# Patient Record
Sex: Female | Born: 1965 | Race: Black or African American | Hispanic: No | Smoking: Never smoker
Health system: Southern US, Community
[De-identification: ages and names within clinical notes are randomized; demographics above are authoritative.]

## PROBLEM LIST (undated history)

## (undated) DIAGNOSIS — Z8489 Family history of other specified conditions: Secondary | ICD-10-CM

## (undated) DIAGNOSIS — I10 Essential (primary) hypertension: Secondary | ICD-10-CM

## (undated) DIAGNOSIS — E119 Type 2 diabetes mellitus without complications: Secondary | ICD-10-CM

## (undated) HISTORY — PX: BACK SURGERY: SHX140

## (undated) HISTORY — PX: BREAST REDUCTION SURGERY: SHX8

---

## 2015-09-13 ENCOUNTER — Other Ambulatory Visit: Payer: Self-pay | Admitting: Neurosurgery

## 2015-09-13 DIAGNOSIS — M5416 Radiculopathy, lumbar region: Secondary | ICD-10-CM

## 2015-09-17 ENCOUNTER — Ambulatory Visit
Admission: RE | Admit: 2015-09-17 | Discharge: 2015-09-17 | Disposition: A | Discharge status: Home / Self Care | Payer: BLUE CROSS/BLUE SHIELD | Source: Ambulatory Visit | Attending: Neurosurgery | Admitting: Neurosurgery

## 2015-09-17 DIAGNOSIS — M5416 Radiculopathy, lumbar region: Secondary | ICD-10-CM

## 2015-09-17 MED ORDER — IOPAMIDOL (ISOVUE-M 200) INJECTION 41%
1.0000 mL | Freq: Once | INTRAMUSCULAR | Status: AC
  Administered 2015-09-17: 1 mL via EPIDURAL

## 2015-09-17 MED ORDER — METHYLPREDNISOLONE ACETATE 40 MG/ML INJ SUSP (RADIOLOG
120.0000 mg | Freq: Once | INTRAMUSCULAR | Status: AC
  Administered 2015-09-17: 120 mg via EPIDURAL

## 2015-09-17 NOTE — Discharge Instructions (Signed)

## 2016-08-11 ENCOUNTER — Telehealth: Payer: Self-pay

## 2016-08-11 NOTE — Telephone Encounter (Signed)
See separate triage.  

## 2016-08-11 NOTE — Telephone Encounter (Signed)
Pt received a letter from DS to set up first colonoscopy. She is having no GI problems. [phone]

## 2016-08-12 NOTE — Telephone Encounter (Signed)
Gastroenterology Pre-Procedure Review  Request Date: 08/11/2016 Requesting Physician:   PATIENT REVIEW QUESTIONS: The patient responded to the following health history questions as indicated:    1. Diabetes Melitis: Recently diagnosed, but on diet at this time  2. Joint replacements in the past 12 months: no 3. Major health problems in the past 3 months: no 4. Has an artificial valve or MVP: no 5. Has a defibrillator: no 6. Has been advised in past to take antibiotics in advance of a procedure like teeth cleaning: no 7. Family history of colon cancer: no  8. Alcohol Use: no 9. History of sleep apnea: no  10. History of coronary artery or other vascular stents placed within the last 12 months: no    MEDICATIONS & ALLERGIES:    Patient reports the following regarding taking any blood thinners:   Plavix? no Aspirin? no Coumadin? no Brilinta? no Xarelto? no Eliquis? no Pradaxa? no Savaysa? no Effient? no  Patient confirms/reports the following medications:  Current Outpatient Prescriptions  Medication Sig Dispense Refill  . hydrochlorothiazide (HYDRODIURIL) 25 MG tablet Take 25 mg by mouth daily.     No current facility-administered medications for this visit.     Patient confirms/reports the following allergies:  No Known Allergies  No orders of the defined types were placed in this encounter.   AUTHORIZATION INFORMATION Primary Insurance:   ID #:   Group #:  Pre-Cert / Auth required:  Pre-Cert / Auth #:   Secondary Insurance:   ID #:   Group #:  Pre-Cert / Auth required:  Pre-Cert / Auth #:   SCHEDULE INFORMATION: Procedure has been scheduled as follows:  Date:              Time:   Location:   This Gastroenterology Pre-Precedure Review Form is being routed to the following provider(s): Barney Drain, MD

## 2016-08-12 NOTE — Telephone Encounter (Signed)

## 2016-08-22 NOTE — Telephone Encounter (Signed)
Left VM to call to get appt date and time for procedure. Mailing a letter to call also.

## 2016-08-26 NOTE — Telephone Encounter (Signed)
PT has been scheduled for 10/20/2016 at 1:00 pm with Dr. Oneida Alar.

## 2016-08-28 ENCOUNTER — Other Ambulatory Visit: Payer: Self-pay

## 2016-08-28 DIAGNOSIS — Z1211 Encounter for screening for malignant neoplasm of colon: Secondary | ICD-10-CM

## 2016-08-28 MED ORDER — NA SULFATE-K SULFATE-MG SULF 17.5-3.13-1.6 GM/177ML PO SOLN: 1.0000 | ORAL | 0 refills | Status: DC

## 2016-08-28 NOTE — Telephone Encounter (Signed)
Rx sent to the pharmacy and instructions mailed to pt.  

## 2016-08-28 NOTE — Telephone Encounter (Signed)
Coupon for the [employer] mailed to pt.

## 2016-09-11 NOTE — Telephone Encounter (Signed)
NO PA is needed for TCS 

## 2016-10-20 ENCOUNTER — Encounter (HOSPITAL_COMMUNITY): Payer: Self-pay | Admitting: *Deleted

## 2016-10-20 ENCOUNTER — Encounter (HOSPITAL_COMMUNITY)
Admission: RE | Disposition: A | Discharge status: Home / Self Care | Payer: Self-pay | Source: Ambulatory Visit | Attending: Gastroenterology

## 2016-10-20 ENCOUNTER — Ambulatory Visit (HOSPITAL_COMMUNITY)
Admission: RE | Admit: 2016-10-20 | Discharge: 2016-10-20 | Disposition: A | Discharge status: Home / Self Care | Payer: BLUE CROSS/BLUE SHIELD | Source: Ambulatory Visit | Attending: Gastroenterology | Admitting: Gastroenterology

## 2016-10-20 DIAGNOSIS — K644 Residual hemorrhoidal skin tags: Secondary | ICD-10-CM | POA: Diagnosis not present

## 2016-10-20 DIAGNOSIS — Z79899 Other long term (current) drug therapy: Secondary | ICD-10-CM | POA: Insufficient documentation

## 2016-10-20 DIAGNOSIS — Z1212 Encounter for screening for malignant neoplasm of rectum: Secondary | ICD-10-CM

## 2016-10-20 DIAGNOSIS — D124 Benign neoplasm of descending colon: Secondary | ICD-10-CM | POA: Diagnosis not present

## 2016-10-20 DIAGNOSIS — Q438 Other specified congenital malformations of intestine: Secondary | ICD-10-CM | POA: Insufficient documentation

## 2016-10-20 DIAGNOSIS — I10 Essential (primary) hypertension: Secondary | ICD-10-CM | POA: Insufficient documentation

## 2016-10-20 DIAGNOSIS — K648 Other hemorrhoids: Secondary | ICD-10-CM | POA: Diagnosis not present

## 2016-10-20 DIAGNOSIS — D123 Benign neoplasm of transverse colon: Secondary | ICD-10-CM | POA: Insufficient documentation

## 2016-10-20 DIAGNOSIS — Z1211 Encounter for screening for malignant neoplasm of colon: Secondary | ICD-10-CM

## 2016-10-20 HISTORY — DX: Essential (primary) hypertension: I10

## 2016-10-20 HISTORY — DX: Family history of other specified conditions: Z84.89

## 2016-10-20 HISTORY — PX: COLONOSCOPY: SHX5424

## 2016-10-20 HISTORY — DX: Type 2 diabetes mellitus without complications: E11.9

## 2016-10-20 LAB — GLUCOSE, CAPILLARY: Glucose-Capillary: 132 mg/dL — ABNORMAL HIGH (ref 65–99)

## 2016-10-20 SURGERY — COLONOSCOPY
Anesthesia: Moderate Sedation

## 2016-10-20 MED ORDER — MIDAZOLAM HCL 5 MG/5ML IJ SOLN
INTRAMUSCULAR | Status: AC
  Filled 2016-10-20: qty 10

## 2016-10-20 MED ORDER — STERILE WATER FOR IRRIGATION IR SOLN
Status: DC | PRN
  Administered 2016-10-20: 2.5 mL

## 2016-10-20 MED ORDER — MEPERIDINE HCL 100 MG/ML IJ SOLN
INTRAMUSCULAR | Status: DC
Start: 2016-10-20 — End: 2016-10-20
  Filled 2016-10-20: qty 2

## 2016-10-20 MED ORDER — MEPERIDINE HCL 100 MG/ML IJ SOLN
INTRAMUSCULAR | Status: DC | PRN
  Administered 2016-10-20 (×4): 25 mg via INTRAVENOUS

## 2016-10-20 MED ORDER — SODIUM CHLORIDE 0.9 % IV SOLN
INTRAVENOUS | Status: DC
  Administered 2016-10-20: 1000 mL via INTRAVENOUS

## 2016-10-20 MED ORDER — MIDAZOLAM HCL 5 MG/5ML IJ SOLN
INTRAMUSCULAR | Status: DC | PRN
  Administered 2016-10-20 (×3): 2 mg via INTRAVENOUS

## 2016-10-20 NOTE — Discharge Instructions (Signed)
You had 2 polyps removed. You have internal hemorrhoids.   CONTINUE YOUR WEIGHT LOSS EFFORTS. LOSE TEN POUNDS.  WHILE I DO NOT WANT TO ALARM YOU, YOUR BODY MASS INDEX IS OVER 30 WHICH MEANS YOU ARE OBESE. Obesity activates cancer genes and IS ASSOCIATED WITH AN INCREASE RISK FOR ALL CANCERS, INCLUDING ESOPHAGEAL AND COLON CANCER. TRANSITION TO A PLANT BASED DIET-NO MEAT OR DAIRY for 6 MOS. AVOID ITEMS THAT CAUSE BLOATING & GAS. I RECOMMEND THE BOOK, "PREVENT AND REVERSE HEART DISEASE", CALDWELL ESSELSTYN JR., MD. PAGES 120-121 [place] THE DIET AND THE LAST HALF OF THE BOOK HAS QUICK AND EASY RECIPES FOR BREAKFAST, LUNCH, AND DINNER ARE AFTER P 127.     DRINK WATER TO KEEP YOUR URINE LIGHT YELLOW.  FOLLOW A HIGH FIBER DIET. AVOID ITEMS THAT CAUSE BLOATING & GAS. SEE INFO BELOW.  YOUR BIOPSY RESULTS WILL BE AVAILABLE IN MY CHART AFTER JUL 27 AND MY OFFICE WILL CONTACT YOU IN 10-14 DAYS WITH YOUR RESULTS.   Next colonoscopy in 5-10 years.    Colonoscopy Care After Read the instructions outlined below and refer to this sheet in the next week. These discharge instructions provide you with general information on caring for yourself after you leave the hospital. While your treatment has been planned according to the most current medical practices available, unavoidable complications occasionally occur. If you have any problems or questions after discharge, call DR. Kevia Zaucha, [phone].  ACTIVITY  You may resume your regular activity, but move at a slower pace for the next 24 hours.   Take frequent rest periods for the next 24 hours.   Walking will help get rid of the air and reduce the bloated feeling in your belly (abdomen).   No driving for 24 hours (because of the medicine (anesthesia) used during the test).   You may shower.   Do not sign any important legal documents or operate any machinery for 24 hours (because of the anesthesia used during the test).     NUTRITION  Drink plenty of fluids.   You may resume your normal diet as instructed by your doctor.   Begin with a light meal and progress to your normal diet. Heavy or fried foods are harder to digest and may make you feel sick to your stomach (nauseated).   Avoid alcoholic beverages for 24 hours or as instructed.    MEDICATIONS  You may resume your normal medications.   WHAT YOU CAN EXPECT TODAY  Some feelings of bloating in the abdomen.   Passage of more gas than usual.   Spotting of blood in your stool or on the toilet paper  .  IF YOU HAD POLYPS REMOVED DURING THE COLONOSCOPY:  Eat a soft diet IF YOU HAVE NAUSEA, BLOATING, ABDOMINAL PAIN, OR VOMITING.    FINDING OUT THE RESULTS OF YOUR TEST Not all test results are available during your visit. DR. Oneida Alar WILL CALL YOU WITHIN 14 DAYS OF YOUR PROCEDUE WITH YOUR RESULTS. Do not assume everything is normal if you have not heard from DR. Faisal Stradling, CALL HER OFFICE AT [phone].  SEEK IMMEDIATE MEDICAL ATTENTION AND CALL THE OFFICE: [phone] IF:  You have more than a spotting of blood in your stool.   Your belly is swollen (abdominal distention).   You are nauseated or vomiting.   You have a temperature over 101F.   You have abdominal pain or discomfort that is severe or gets worse throughout the day.   High-Fiber Diet  A high-fiber diet changes your normal diet to include more whole grains, legumes, fruits, and vegetables. Changes in the diet involve replacing refined carbohydrates with unrefined foods. The calorie level of the diet is essentially unchanged. The Dietary Reference Intake (recommended amount) for adult males is 38 grams per day. For adult females, it is 25 grams per day. Pregnant and lactating women should consume 28 grams of fiber per day. Fiber is the intact part of a plant that is not broken down during digestion. Functional fiber is fiber that has been isolated from the plant to provide a  beneficial effect in the body. PURPOSE  Increase stool bulk.   Ease and regulate bowel movements.   Lower cholesterol.   REDUCE RISK OF COLON CANCER  INDICATIONS THAT YOU NEED MORE FIBER  Constipation and hemorrhoids.   Uncomplicated diverticulosis (intestine condition) and irritable bowel syndrome.   Weight management.   As a protective measure against hardening of the arteries (atherosclerosis), diabetes, and cancer.   GUIDELINES FOR INCREASING FIBER IN THE DIET  Start adding fiber to the diet slowly. A gradual increase of about 5 more grams (2 slices of whole-wheat bread, 2 servings of most fruits or vegetables, or 1 bowl of high-fiber cereal) per day is best. Too rapid an increase in fiber may result in constipation, flatulence, and bloating.   Drink enough water and fluids to keep your urine clear or pale yellow. Water, juice, or caffeine-free drinks are recommended. Not drinking enough fluid may cause constipation.   Eat a variety of high-fiber foods rather than one type of fiber.   Try to increase your intake of fiber through using high-fiber foods rather than fiber pills or supplements that contain small amounts of fiber.   The goal is to change the types of food eaten. Do not supplement your present diet with high-fiber foods, but replace foods in your present diet.   INCLUDE A VARIETY OF FIBER SOURCES  Replace refined and processed grains with whole grains, canned fruits with fresh fruits, and incorporate other fiber sources. White rice, white breads, and most bakery goods contain little or no fiber.   Brown whole-grain rice, buckwheat oats, and many fruits and vegetables are all good sources of fiber. These include: broccoli, Brussels sprouts, cabbage, cauliflower, beets, sweet potatoes, white potatoes (skin on), carrots, tomatoes, eggplant, squash, berries, fresh fruits, and dried fruits.   Cereals appear to be the richest source of fiber. Cereal fiber is found in  whole grains and bran. Bran is the fiber-rich outer coat of cereal grain, which is largely removed in refining. In whole-grain cereals, the bran remains. In breakfast cereals, the largest amount of fiber is found in those with "bran" in their names. The fiber content is sometimes indicated on the label.   You may need to include additional fruits and vegetables each day.   In baking, for 1 cup white flour, you may use the following substitutions:   1 cup whole-wheat flour minus 2 tablespoons.   1/2 cup white flour plus 1/2 cup whole-wheat flour.   Polyps, Colon  A polyp is extra tissue that grows inside your body. Colon polyps grow in the large intestine. The large intestine, also called the colon, is part of your digestive system. It is a long, hollow tube at the end of your digestive tract where your body makes and stores stool. Most polyps are not dangerous. They are benign. This means they are not cancerous. But over time, some types of polyps can  turn into cancer. Polyps that are smaller than a pea are usually not harmful. But larger polyps could someday become or may already be cancerous. To be safe, doctors remove all polyps and test them.   WHO GETS POLYPS? Anyone can get polyps, but certain people are more likely than others. You may have a greater chance of getting polyps if:  You are over 50.   You have had polyps before.   Someone in your family has had polyps.   Someone in your family has had cancer of the large intestine.   Find out if someone in your family has had polyps. You may also be more likely to get polyps if you:   Eat a lot of fatty foods   Smoke   Drink alcohol   Do not exercise  Eat too much   PREVENTION There is not one sure way to prevent polyps. You might be able to lower your risk of getting them if you:  Eat more fruits and vegetables and less fatty food.   Do not smoke.   Avoid alcohol.   Exercise every day.   Lose weight if you are  overweight.   Eating more calcium and folate can also lower your risk of getting polyps. Some foods that are rich in calcium are milk, cheese, and broccoli. Some foods that are rich in folate are chickpeas, kidney beans, and spinach.   Hemorrhoids Hemorrhoids are dilated (enlarged) veins around the rectum. Sometimes clots will form in the veins. This makes them swollen and painful. These are called thrombosed hemorrhoids. Causes of hemorrhoids include:  Constipation.   Straining to have a bowel movement.   HEAVY LIFTING  HOME CARE INSTRUCTIONS  Eat a well balanced diet and drink 6 to 8 glasses of water every day to avoid constipation. You may also use a bulk laxative.   Avoid straining to have bowel movements.   Keep anal area dry and clean.   Do not use a donut shaped pillow or sit on the toilet for long periods. This increases blood pooling and pain.   Move your bowels when your body has the urge; this will require less straining and will decrease pain and pressure.

## 2016-10-20 NOTE — H&P (Signed)
Primary Care Physician:  Manon Hilding, MD Primary Gastroenterologist:  Dr. Oneida Alar  Pre-Procedure History & Physical: HPI:  Gabriela Rush is a 51 y.o. female here for COLON CANCER SCREENING.   PMH: HTN  No past surgical history on file.  Prior to Admission medications   Medication Sig Start Date End Date Taking? Authorizing Provider  hydrochlorothiazide (HYDRODIURIL) 25 MG tablet Take 25 mg by mouth daily.   Yes [provider]    Allergies as of 08/28/2016  . (No Known Allergies)    FAMILY HISTORY: NO COLON CANCER/POLYPS  Social History   Social History  . Marital status: Widowed    Spouse name: N/A  . Number of children: N/A  . Years of education: N/A   Occupational History  . Not on file.   Social History Main Topics  . Smoking status: Never Smoker  . Smokeless tobacco: Never Used  . Alcohol use Not on file  . Drug use: Unknown  . Sexual activity: Not on file   Other Topics Concern  . Not on file   Social History Narrative  . No narrative on file    Review of Systems: See HPI, otherwise negative ROS   Physical Exam: LMP 08/11/2016  General:   Alert,  pleasant and cooperative in NAD Head:  Normocephalic and atraumatic. Neck:  Supple; Lungs:  Clear throughout to auscultation.    Heart:  Regular rate and rhythm. Abdomen:  Soft, nontender and nondistended. Normal bowel sounds, without guarding, and without rebound.   Neurologic:  Alert and  oriented x4;  grossly normal neurologically.  Impression/Plan:     SCREENING  Plan:  1. TCS TODAY. DISCUSSED PROCEDURE, BENEFITS, & RISKS: < 1% chance of medication reaction, bleeding, perforation, or rupture of spleen/liver.

## 2016-10-20 NOTE — Op Note (Signed)
[facility] Patient Name: Gabriela Rush Procedure Date: 10/20/2016 12:46 PM MRN: [id number] Date of Birth: [birth year] Attending MD: Barney Drain , MD CSN: [id number] Age: 51 Admit Type: Outpatient Procedure:                Colonoscopy with COLD SNARE/SNARE CAUTERY                            POLYPECTOMY Indications:              Screening for colorectal malignant neoplasm Providers:                Barney Drain, MD, Gwynneth Albright RN, RN,                            Randa Spike, Technician Referring MD:             Manon Hilding MD, MD Medicines:                Meperidine 100 mg IV, Midazolam 6 mg IV Complications:            No immediate complications. Estimated Blood Loss:     Estimated blood loss was minimal. Procedure:                Pre-Anesthesia Assessment:                           - Prior to the procedure, a History and Physical                            was performed, and patient medications and                            allergies were reviewed. The patient's tolerance of                            previous anesthesia was also reviewed. The risks                            and benefits of the procedure and the sedation                            options and risks were discussed with the patient.                            All questions were answered, and informed consent                            was obtained. Prior Anticoagulants: The patient has                            taken no previous anticoagulant or antiplatelet                            agents. ASA Grade Assessment: II - A patient with  mild systemic disease. After reviewing the risks                            and benefits, the patient was deemed in                            satisfactory condition to undergo the procedure.                            After obtaining informed consent, the colonoscope                            was passed under direct vision. Throughout the                             procedure, the patient's blood pressure, pulse, and                            oxygen saturations were monitored continuously. The                            EC-[id number] ([id number]) scope was introduced through                            the anus and advanced to the the cecum, identified                            by appendiceal orifice and ileocecal valve. The                            colonoscopy was technically difficult and complex                            due to significant looping. Successful completion                            of the procedure was aided by increasing the dose                            of sedation medication and COLOWRAP. The patient                            tolerated the procedure fairly well. The quality of                            the bowel preparation was excellent. The ileocecal                            valve, appendiceal orifice, and rectum were                            photographed. Scope In: 1:09:13 PM Scope Out: 1:26:43 PM Scope Withdrawal Time: 0 hours 13 minutes 24 seconds  Total Procedure Duration: 0  hours 17 minutes 30 seconds  Findings:      A 5 mm polyp was found in the mid descending colon. The polyp was       sessile. The polyp was removed with a hot snare. Resection and retrieval       were complete.      A 3 mm polyp was found in the distal transverse colon. The polyp was       sessile. The polyp was removed with a cold biopsy forceps. Resection and       retrieval were complete.      External and internal hemorrhoids were found during retroflexion. The       hemorrhoids were moderate. Impression:               - One 5 mm polyp in the mid descending colon,                            removed with a hot snare. Resected and retrieved.                           - One 3 mm polyp in the distal transverse colon,                            removed with a cold biopsy forceps. Resected and                             retrieved.                           - External and internal hemorrhoids. Moderate Sedation:      Moderate (conscious) sedation was administered by the endoscopy nurse       and supervised by the endoscopist. The following parameters were       monitored: oxygen saturation, heart rate, blood pressure, and response       to care. Total physician intraservice time was 28 minutes. Recommendation:           - Repeat colonoscopy in 5-10 years for surveillance.                           - High fiber diet.                           - Continue present medications. LOSE WEIGHT TO BMI                            < 30. CONSIDER A PLANT BASED DIET.                           - Patient has a contact number available for                            emergencies. The signs and symptoms of potential                            delayed complications were discussed with the  patient. Return to normal activities tomorrow.                            Written discharge instructions were provided to the                            patient. Procedure Code(s):        --- Professional ---                           [phone], Colonoscopy, flexible; with removal of                            tumor(s), polyp(s), or other lesion(s) by snare                            technique                           45380, 59, Colonoscopy, flexible; with biopsy,                            single or multiple                           99152, Moderate sedation services provided by the                            same physician or other qualified health care                            professional performing the diagnostic or                            therapeutic service that the sedation supports,                            requiring the presence of an independent trained                            observer to assist in the monitoring of the                            patient's level of consciousness and physiological                             status; initial 15 minutes of intraservice time,                            patient age 39 years or older                           [id number], Moderate sedation services; each additional                            15 minutes intraservice  time Diagnosis Code(s):        --- Professional ---                           Z12.11, Encounter for screening for malignant                            neoplasm of colon                           D12.4, Benign neoplasm of descending colon                           D12.3, Benign neoplasm of transverse colon (hepatic                            flexure or splenic flexure)                           K64.8, Other hemorrhoids CPT copyright 2016 [employer]. All rights reserved. The codes documented in this report are preliminary and upon coder review may  be revised to meet current compliance requirements. Barney Drain, MD Barney Drain, MD 10/20/2016 1:38:57 PM This report has been signed electronically. Number of Addenda: 0

## 2016-10-22 ENCOUNTER — Telehealth: Payer: Self-pay | Admitting: Gastroenterology

## 2016-10-22 NOTE — Telephone Encounter (Signed)
Pt is aware.  

## 2016-10-22 NOTE — Telephone Encounter (Signed)
Please call pt. She had TWO simple adenomas removed. NEXT COLONOSCOPY IN 5-10 YEARS.

## 2016-10-23 ENCOUNTER — Encounter (HOSPITAL_COMMUNITY): Payer: Self-pay | Admitting: Gastroenterology

## 2016-10-23 NOTE — Telephone Encounter (Signed)
Reminder in epic °

## 2017-02-02 ENCOUNTER — Telehealth: Payer: Self-pay

## 2017-02-02 NOTE — Telephone Encounter (Signed)
PT called to ask about results from her colonoscopy on 10/20/2016. I told her and told her that I had called her on 10/22/2016 and gave her the results. She said she was not aware. She had went to a doctor today and he told her the results and said she might need a colonoscopy before 5 years that she should call here. I told her again that Dr. Oneida Alar said she should have her next one in 5-10 years.

## 2017-02-03 NOTE — Telephone Encounter (Signed)
PLEASE CALL PT. SHE HAD TWO SIMPLE ADENOMAS REMOVED SHE DOES NOT NEED ANOTHER ROUTINE COLONOSCOPY BEFORE 2023. IF SHE HAS OTHER GI CONCERNS SHE CAN MAKE AN APPT TO DISCUSS THEM.

## 2017-02-04 NOTE — Telephone Encounter (Signed)
PT is aware.

## 2017-02-04 NOTE — Telephone Encounter (Signed)
LMOM to call.

## 2017-02-05 NOTE — Telephone Encounter (Signed)
REVIEWED-NO ADDITIONAL RECOMMENDATIONS. 

## 2017-02-05 NOTE — Telephone Encounter (Signed)
Pt called back and said she would like to get a copy of her colonoscopy report. I told her that she will have to come by and sign a release. She said she will be by today or tomorrow before noon.

## 2017-05-07 ENCOUNTER — Encounter (INDEPENDENT_AMBULATORY_CARE_PROVIDER_SITE_OTHER): Payer: Self-pay | Admitting: Orthopaedic Surgery

## 2017-05-07 ENCOUNTER — Ambulatory Visit (INDEPENDENT_AMBULATORY_CARE_PROVIDER_SITE_OTHER): Payer: BLUE CROSS/BLUE SHIELD | Admitting: Orthopaedic Surgery

## 2017-05-07 VITALS — BP 122/77 | HR 83

## 2017-05-07 DIAGNOSIS — M48062 Spinal stenosis, lumbar region with neurogenic claudication: Secondary | ICD-10-CM

## 2017-05-07 DIAGNOSIS — M48061 Spinal stenosis, lumbar region without neurogenic claudication: Secondary | ICD-10-CM | POA: Diagnosis not present

## 2017-05-07 NOTE — Addendum Note (Signed)
Addended by: Meyer Cory on: 05/07/2017 11:19 AM   Modules accepted: Orders

## 2017-05-07 NOTE — Progress Notes (Signed)
Office Visit Note   Patient: Gabriela Rush           Date of Birth: [birth year]           MRN: [id number] Visit Date: 05/07/2017              Requested by: Sasser, Silvestre Moment, MD [place], [place] [address] PCP: Manon Hilding, MD   Assessment & Plan: Visit Diagnoses:  1. Degenerative lumbar spinal stenosis   2. Neurogenic claudication due to lumbar spinal stenosis     Plan: Patient has neurogenic claudication with lumbar stenosis with L4-5 instability shifting on plain radiographs.  I recommend lumbar myelogram CT scan and I discussed with her that she likely will need a single level lumbar fusion for her progressive claudication symptoms.  This is progressed to the point where she has problems working and needs to keep her job.  Office follow-up after lumbar myelogram CT scan.  She is failed anti-inflammatories epidural steroid injections she has been through physical therapy she is home exercise program she is taken anti-inflammatories and also use the brace all without relief.  Follow-Up Instructions: No Follow-up on file.   Orders:  No orders of the defined types were placed in this encounter.  No orders of the defined types were placed in this encounter.     Procedures: No procedures performed   Clinical Data: No additional findings.   Subjective: Chief Complaint  Patient presents with  . Right Leg - Pain    HPI a 52 year old female with progressive back pain right greater than left leg pain with neurogenic claudication symptoms that have been present for more than 2 years.  Her pain is progressed the point where she has to lean on objects while she works.  She works as a Scientist, water quality.  She can go to the store as long she leans on a grocery cart.  Without it she has to stop and sit down.  She gets good relief with supine position or sitting.  When she stands up she has recurrent symptoms after 200 feet.  Patient does have some diabetes and also hypertension.  Previous MRI  2017 showed 2 mm of listhesis.  Flexion extension lateral x-rays showed changes at L4-5 anterolisthesis from 8 mm with extension to 13 mm with flexion.  Other levels show no anterolisthesis.  She had minimal disc narrowing at L4-5.  Patient denies any bowel bladder symptoms no fever chills no previous back surgeries.  She has had previous epidurals did not get relief.  She is used over-the-counter anti-inflammatories without relief.  She wears a back brace this is not been helpful.  Review of Systems 14 point review of system positive for hypertension.  Type 2 diabetes on metformin diet controlled.  She had previous breast reduction about 14 or 15 years ago.  Chronic back pain and claudication symptoms progressive over the last 2-3 years.  Patient is a widow she does not smoke but rarely drinks.  Negative as pertains HPI.   Objective: Vital Signs: BP 122/77   Pulse 83   Physical Exam  Constitutional: She is oriented to person, place, and time. She appears well-developed.  HENT:  Head: Normocephalic.  Right Ear: External ear normal.  Left Ear: External ear normal.  Eyes: Pupils are equal, round, and reactive to light.  Neck: No tracheal deviation present. No thyromegaly present.  Cardiovascular: Normal rate.  Pulmonary/Chest: Effort normal.  Abdominal: Soft.  Neurological: She is alert and oriented to  person, place, and time.  Skin: Skin is warm and dry.  Psychiatric: She has a normal mood and affect. Her behavior is normal.    Ortho Exam patient has intact reflexes negative straight leg raising 90 degrees she has some sciatic notch tenderness minimal trochanteric bursal tenderness.  Some bilateral tight hamstrings.  Anterior tib EHL gastrocsoleus peroneals are strong and good quad strength no hip flexion weakness.  Negative logroll to the hips.  Distal pulses are intact no plantar foot lesions.  No rash on exposed skin no venous stasis changes.  Specialty Comments:  No specialty  comments available.  Imaging: Previous images 2007 lumbar spine flexion extension lateral showed degenerative spondylolisthesis at L4-5 with intact pars with shifting from 8-14.5 mm with flexion and extension.  The previous lumbar MRI scan 2017 showed mild disc desiccation in the lumbar region without significant bulging no foraminal or lateral recess stenosis.  In the supine position she only had 2 mm of anterolisthesis at L4-5.   PMFS History: Patient Active Problem List   Diagnosis Date Noted  . Special screening for malignant neoplasms, colon    Past Medical History:  Diagnosis Date  . Diabetes mellitus without complication ([place])   . Family history of adverse reaction to anesthesia    Mother had problem with anesthesia; The "lost" her during a surgery; brought her back.  . Hypertension     Family History  Problem Relation Age of Onset  . Hypertension Mother   . Stroke Mother   . Kidney failure Mother   . Hypertension Father   . Diabetes Father   . Hypertension Sister   . Diabetes Sister   . Hypertension Sister   . Diabetes Sister     Past Surgical History:  Procedure Laterality Date  . BREAST REDUCTION SURGERY    . CESAREAN SECTION  1988  . COLONOSCOPY N/A 10/20/2016   Procedure: COLONOSCOPY;  Surgeon: Danie Binder, MD;  [facility];  Service: Endoscopy;  Laterality: N/A;  1:00 pm   Social History   Occupational History  . Not on file  Tobacco Use  . Smoking status: Never Smoker  . Smokeless tobacco: Never Used  Substance and Sexual Activity  . Alcohol use: Yes    Alcohol/week: 0.0 oz    Comment: Occasionally  . Drug use: No  . Sexual activity: Not Currently

## 2017-05-18 ENCOUNTER — Telehealth (INDEPENDENT_AMBULATORY_CARE_PROVIDER_SITE_OTHER): Payer: Self-pay | Admitting: Orthopaedic Surgery

## 2017-05-18 NOTE — Telephone Encounter (Signed)
Patient called asking for some pain medication to hold her over til she can get her surgery scheduled. She states she's still currently working through the pain but just wants something to help ease it in the time being. CB # [id number]

## 2017-05-18 NOTE — Telephone Encounter (Signed)
Can you please check on Myelo/CT? It was entered for [facility]. Patient states that she has not heard anything.  Thanks.

## 2017-05-18 NOTE — Telephone Encounter (Signed)
Please advise 

## 2017-05-18 NOTE — Telephone Encounter (Signed)
Pain med will not help lumbar stenosis with instability. Needs myelo/CT and ROV after test, she states no one has called her about her test. Please follow up thanks.

## 2017-05-19 ENCOUNTER — Other Ambulatory Visit (INDEPENDENT_AMBULATORY_CARE_PROVIDER_SITE_OTHER): Payer: Self-pay | Admitting: Orthopaedic Surgery

## 2017-05-19 DIAGNOSIS — M48061 Spinal stenosis, lumbar region without neurogenic claudication: Secondary | ICD-10-CM

## 2017-05-19 DIAGNOSIS — G9519 Other vascular myelopathies: Secondary | ICD-10-CM

## 2017-05-19 DIAGNOSIS — M48062 Spinal stenosis, lumbar region with neurogenic claudication: Secondary | ICD-10-CM

## 2017-05-19 NOTE — Telephone Encounter (Signed)
I called Gabriela Rush at [facility] imaging and she will contact pt to schedule pt. Dr. Lorin Mercy did not put in the CT lumbar with contrast only the Myelogram. Reason pt has not been called to get scheduled.

## 2017-06-01 ENCOUNTER — Other Ambulatory Visit (INDEPENDENT_AMBULATORY_CARE_PROVIDER_SITE_OTHER): Payer: Self-pay | Admitting: Orthopaedic Surgery

## 2017-06-01 ENCOUNTER — Ambulatory Visit
Admission: RE | Admit: 2017-06-01 | Discharge: 2017-06-01 | Disposition: A | Discharge status: Home / Self Care | Payer: BLUE CROSS/BLUE SHIELD | Source: Ambulatory Visit | Attending: Orthopaedic Surgery | Admitting: Orthopaedic Surgery

## 2017-06-01 DIAGNOSIS — M48061 Spinal stenosis, lumbar region without neurogenic claudication: Secondary | ICD-10-CM

## 2017-06-01 DIAGNOSIS — M48062 Spinal stenosis, lumbar region with neurogenic claudication: Secondary | ICD-10-CM

## 2017-06-01 DIAGNOSIS — G9519 Other vascular myelopathies: Secondary | ICD-10-CM

## 2017-06-01 MED ORDER — ONDANSETRON HCL 4 MG/2ML IJ SOLN
4.0000 mg | Freq: Once | INTRAMUSCULAR | Status: AC
  Administered 2017-06-01: 4 mg via INTRAMUSCULAR

## 2017-06-01 MED ORDER — IOPAMIDOL (ISOVUE-M 200) INJECTION 41%
15.0000 mL | Freq: Once | INTRAMUSCULAR | Status: AC
  Administered 2017-06-01: 15 mL via INTRATHECAL

## 2017-06-01 MED ORDER — ONDANSETRON HCL 4 MG/2ML IJ SOLN: 4.0000 mg | Freq: Four times a day (QID) | INTRAMUSCULAR | Status: DC | PRN

## 2017-06-01 MED ORDER — DIAZEPAM 5 MG PO TABS
10.0000 mg | ORAL_TABLET | Freq: Once | ORAL | Status: AC
  Administered 2017-06-01: 10 mg via ORAL

## 2017-06-01 MED ORDER — MEPERIDINE HCL 100 MG/ML IJ SOLN
75.0000 mg | Freq: Once | INTRAMUSCULAR | Status: AC
  Administered 2017-06-01: 75 mg via INTRAMUSCULAR

## 2017-06-01 NOTE — Discharge Instructions (Signed)

## 2017-06-04 ENCOUNTER — Ambulatory Visit (INDEPENDENT_AMBULATORY_CARE_PROVIDER_SITE_OTHER): Payer: BLUE CROSS/BLUE SHIELD | Admitting: Orthopaedic Surgery

## 2017-06-04 ENCOUNTER — Encounter (INDEPENDENT_AMBULATORY_CARE_PROVIDER_SITE_OTHER): Payer: Self-pay | Admitting: Orthopaedic Surgery

## 2017-06-04 VITALS — BP 138/101 | HR 79

## 2017-06-04 DIAGNOSIS — M48062 Spinal stenosis, lumbar region with neurogenic claudication: Secondary | ICD-10-CM | POA: Diagnosis not present

## 2017-06-04 NOTE — Progress Notes (Addendum)
Office Visit Note   Patient: Gabriela Rush           Date of Birth: 08-20-1965           MRN: [id number] Visit Date: 06/04/2017              Requested by: Sasser, Silvestre Moment, MD [place], [place] [address] PCP: Manon Hilding, MD   Assessment & Plan: Visit Diagnoses:  1. Spinal stenosis of lumbar region with neurogenic claudication     Plan: MRI scan current myelogram CT scan lumbar.  She has instability L4-5 with degenerative facet changes.  She will require a Gill procedure for decompression instrumented fusion with interbody cage for her instability and severe stenosis with neurogenic claudication symptoms.  We discussed the operative technique postoperative rehab.  Lumbar brace after surgery be out of work for about 2 months.  Gust including dural tear, cage migration, pseudoarthrosis possible revision surgery.  Potential for development of disc degeneration at other levels.  For her severe instability discussed.  She understands and requests we proceed.  A copy of her myelogram CT scan was given to her today.  Questions were elicited and answered.  Follow-Up Instructions: No Follow-up on file.   Orders:  No orders of the defined types were placed in this encounter.  No orders of the defined types were placed in this encounter.     Procedures: No procedures performed   Clinical Data: No additional findings.   Subjective: Chief Complaint  Patient presents with  . Lower Back - Pain, Follow-up    HPI  52 year old female returns ongoing problems with ptosis and instability at L4-5 with 9 mm of shift going from supine to standing flexion position with myelographic cut off with flexion.  She has no significant abnormalities of at other levels.  She has pain with standing after.  Severe leg weakness feet ambulation.  Ending lateral radiographs showed shifting from 2 mm to 13 mm on flexion-extension views.  She has to use a  grocery cart to go to the store.  Bracht brace,  taken anti-inflammatories.  She continues to work.  No chills or fever no bowel or bladder symptoms.  Unable to go to a   store without leaning on a grocery cart.  Relief with sitting or supine position.  Working she has to stop and lean forward put her arms down to bar spine.  Review of Systems review of systems positive for hypertension, type 2 diabetes on metformin.  Previous breast reduction 14-15 years ago.  Chronic back pain with neurogenic claudication symptoms.  She is a non-smoker.  Otherwise review of systems is negative.   Objective: Vital Signs: BP (!) 138/101   Pulse 79   Physical Exam  Constitutional: She is oriented to person, place, and time. She appears well-developed.  HENT:  Head: Normocephalic.  Right Ear: External ear normal.  Left Ear: External ear normal.  Eyes: Pupils are equal, round, and reactive to light.  Neck: No tracheal deviation present. No thyromegaly present.  Cardiovascular: Normal rate.  Pulmonary/Chest: Effort normal.  Abdominal: Soft.  Neurological: She is alert and oriented to person, place, and time.  Skin: Skin is warm and dry.  Psychiatric: She has a normal mood and affect. Her behavior is normal.    Ortho Exam of the lumbar spine is normal.  Anterior tib gastrocsoleus peroneals quads hip flexors are normal without weakness.  Negative logroll to the hips.  Distal pulses are intact.  No plantar foot lesions.  Dorsalis pedis posterior tib is normal.  Knees reach full extension but no quad atrophy.  Specialty Comments:  No specialty comments available.  Imaging:CLINICAL DATA:  Low back pain. Bilateral leg pain and numbness right worse than left.  EXAM: LUMBAR MYELOGRAM  FLUOROSCOPY TIME:  0 minutes 51 seconds. 145.38 micro gray meter squared  PROCEDURE: After thorough discussion of risks and benefits of the procedure including bleeding, infection, injury to nerves, blood vessels, adjacent structures as well as headache and CSF leak,  written and oral informed consent was obtained. Consent was obtained by Dr. Nelson Chimes. Time out form was completed.  Patient was positioned prone on the fluoroscopy table. Local anesthesia was provided with 1% lidocaine without epinephrine after prepped and draped in the usual sterile fashion. Puncture was performed at L2-3 using a 3 1/2 inch 22-gauge spinal needle via left para median approach. Using a single pass through the dura, the needle was placed within the thecal sac, with return of clear CSF. 15 mL of Isovue M-200 was injected into the thecal sac, with normal opacification of the nerve roots and cauda equina consistent with free flow within the subarachnoid space.  I personally performed the lumbar puncture and administered the intrathecal contrast. I also personally performed acquisition of the myelogram images.  TECHNIQUE: Contiguous axial images were obtained through the Lumbar spine after the intrathecal infusion of infusion. Coronal and sagittal reconstructions were obtained of the axial image sets.  COMPARISON:  Radiography 09/17/2015  FINDINGS: LUMBAR MYELOGRAM FINDINGS:  In the supine position, there is moderate multifactorial stenosis at L4-5 which could cause neural compression in either lateral recess. There is anterolisthesis 1 cm in the supine position. This increases to 16 mm with standing and to 19 mm with standing flexion. No compressive stenosis seen at L5-S1.  CT LUMBAR MYELOGRAM FINDINGS:  No abnormality at L3-4 or above. The discs are normal. The canal and foramina are widely patent. The distal cord and conus are normal. Minimal facet arthritis at L3-4 but no slippage or stenosis.  L4-5: Advanced bilateral facet arthropathy with gaping, fluid-filled facet joints. Anterolisthesis of 6 mm in this position. Circumferential protrusion of the disc with foraminal extension on the right. Neural compression could occur in either lateral  recess and certainly in the foramen on the right.  L5-S1: Facet osteoarthritis without slippage. Bulging of the disc. No compressive stenosis at this level.  Bilateral sacroiliac osteoarthritis.  IMPRESSION: Degenerative spondylolisthesis at L4-5, measuring up to nearly 2 cm with standing and flexion. Spinal stenosis at this level that could cause neural compression in the canal. Right foraminal to extraforaminal disc material likely to focally compress the right L4 nerve in addition.  L5-S1 facet osteoarthritis but no slippage or stenosis.   Electronically Signed   By: Nelson Chimes M.D.   On: 06/01/2017 13:59     PMFS History: Patient Active Problem List   Diagnosis Date Noted  . Special screening for malignant neoplasms, colon    Past Medical History:  Diagnosis Date  . Diabetes mellitus without complication ([place])   . Family history of adverse reaction to anesthesia    Mother had problem with anesthesia; The "lost" her during a surgery; brought her back.  . Hypertension     Family History  Problem Relation Age of Onset  . Hypertension Mother   . Stroke Mother   . Kidney failure Mother   . Hypertension Father   . Diabetes Father   . Hypertension Sister   .  Diabetes Sister   . Hypertension Sister   . Diabetes Sister     Past Surgical History:  Procedure Laterality Date  . BREAST REDUCTION SURGERY    . CESAREAN SECTION  1988  . COLONOSCOPY N/A 10/20/2016   Procedure: COLONOSCOPY;  Surgeon: Danie Binder, MD;  [facility];  Service: Endoscopy;  Laterality: N/A;  1:00 pm   Social History   Occupational History  . Not on file  Tobacco Use  . Smoking status: Never Smoker  . Smokeless tobacco: Never Used  Substance and Sexual Activity  . Alcohol use: Yes    Alcohol/week: 0.0 oz    Comment: Occasionally  . Drug use: No  . Sexual activity: Not Currently

## 2017-06-08 ENCOUNTER — Telehealth (INDEPENDENT_AMBULATORY_CARE_PROVIDER_SITE_OTHER): Payer: Self-pay | Admitting: Orthopaedic Surgery

## 2017-06-08 NOTE — Telephone Encounter (Signed)
I called, discussed. She has stenosis and instability with shifting so much on flexion/extension she needs fusion. Discussed, she understands and recalls our discussion and review. She talked with one person who came into the store who had multilevel fusion and was stiff. Proceed as scheduled.

## 2017-06-08 NOTE — Telephone Encounter (Signed)
Could you please call patient to explain exact procedure? Thanks.

## 2017-06-08 NOTE — Telephone Encounter (Signed)
Patient is scheduled for surgery 06/17/17 @ [facility] (this was done on Friday). This morning, patient called with questions regarding the operative procedure. She mentions having talked to a gentlemen that has had a similar surgery in which the outcome was not too good.  She states that she is very nervous about this and wants to make sure that she is "not having a fusion".  I told her her that I would have Dr. Lorin Mercy or [facility] call and explain the op procedure and her prognosis.  She would like to speak with someone prior to her PCP with Dr Consuello Masse @3pm  TODAY (she wanted instructions on her Metformin).

## 2017-06-08 NOTE — Telephone Encounter (Signed)
Please see message from Dr. Lorin Mercy

## 2017-06-12 NOTE — Pre-Procedure Instructions (Signed)
Nari Vannatter  06/12/2017      [facility] [place], [place] - [id number] [place] [address] Phone: [phone] Fax: [phone]    Your procedure is scheduled on June 17, 2017.  Report to [facility] Admitting at 1030 AM.  Call this number if you have problems the morning of surgery:  [phone]   Remember:  Do not eat food or drink liquids after midnight.  Take these medicines the morning of surgery with A SIP OF WATER fluticasone (flonase)   7 days prior to surgery STOP taking any Aspirin (unless otherwise instructed by your surgeon), Aleve, Naproxen, Ibuprofen, Motrin, Advil, Goody's, BC's, all herbal medications, fish oil, and all vitamins  Continue all other medications as instructed by your physician except follow the above medication instructions before surgery  WHAT DO I DO ABOUT MY DIABETES MEDICATION?  Marland Kitchen Do not take oral diabetes medicines (pills) the morning of surgery metformin (glucophage).  How to Manage Your Diabetes Before and After Surgery  Why is it important to control my blood sugar before and after surgery? . Improving blood sugar levels before and after surgery helps healing and can limit problems. . A way of improving blood sugar control is eating a healthy diet by: o  Eating less sugar and carbohydrates o  Increasing activity/exercise o  Talking with your doctor about reaching your blood sugar goals . High blood sugars (greater than 180 mg/dL) can raise your risk of infections and slow your recovery, so you will need to focus on controlling your diabetes during the weeks before surgery. . Make sure that the doctor who takes care of your diabetes knows about your planned surgery including the date and location.  How do I manage my blood sugar before surgery? . Check your blood sugar at least 4 times a day, starting 2 days before surgery, to make sure that the level is not too high or  low. o Check your blood sugar the morning of your surgery when you wake up and every 2 hours until you get to the Short Stay unit. . If your blood sugar is less than 70 mg/dL, you will need to treat for low blood sugar: o Do not take insulin. o Treat a low blood sugar (less than 70 mg/dL) with  cup of clear juice (cranberry or apple), 4 glucose tablets, OR glucose gel. Recheck blood sugar in 15 minutes after treatment (to make sure it is greater than 70 mg/dL). If your blood sugar is not greater than 70 mg/dL on recheck, call [phone] o  for further instructions. . Report your blood sugar to the short stay nurse when you get to Short Stay.  . If you are admitted to the hospital after surgery: o Your blood sugar will be checked by the staff and you will probably be given insulin after surgery (instead of oral diabetes medicines) to make sure you have good blood sugar levels. o The goal for blood sugar control after surgery is 80-180 mg/dL.   Reviewed and Endorsed by [facility] Patient Education Committee, August 2015   Do not wear jewelry, make-up or nail polish.  Do not wear lotions, powders, or perfumes, or deodorant.  Do not shave 48 hours prior to surgery.    Do not bring valuables to the hospital.  [facility] is not responsible for any belongings or valuables.  Contacts, dentures or bridgework may not be worn into surgery.  Leave  your suitcase in the car.  After surgery it may be brought to your room.  For patients admitted to the hospital, discharge time will be determined by your treatment team.  Patients discharged the day of surgery will not be allowed to drive home.   [place]- Preparing For Surgery  Before surgery, you can play an important role. Because skin is not sterile, your skin needs to be as free of germs as possible. You can reduce the number of germs on your skin by washing with CHG (chlorahexidine gluconate) Soap before surgery.  CHG is an antiseptic  cleaner which kills germs and bonds with the skin to continue killing germs even after washing.  Please do not use if you have an allergy to CHG or antibacterial soaps. If your skin becomes reddened/irritated stop using the CHG.  Do not shave (including legs and underarms) for at least 48 hours prior to first CHG shower. It is OK to shave your face.  Please follow these instructions carefully.   1. Shower the NIGHT BEFORE SURGERY and the MORNING OF SURGERY with CHG.   2. If you chose to wash your hair, wash your hair first as usual with your normal shampoo.  3. After you shampoo, rinse your hair and body thoroughly to remove the shampoo.  4. Use CHG as you would any other liquid soap. You can apply CHG directly to the skin and wash gently with a scrungie or a clean washcloth.   5. Apply the CHG Soap to your body ONLY FROM THE NECK DOWN.  Do not use on open wounds or open sores. Avoid contact with your eyes, ears, mouth and genitals (private parts). Wash Face and genitals (private parts)  with your normal soap.  6. Wash thoroughly, paying special attention to the area where your surgery will be performed.  7. Thoroughly rinse your body with warm water from the neck down.  8. DO NOT shower/wash with your normal soap after using and rinsing off the CHG Soap.  9. Pat yourself dry with a CLEAN TOWEL.  10. Wear CLEAN PAJAMAS to bed the night before surgery, wear comfortable clothes the morning of surgery  11. Place CLEAN SHEETS on your bed the night of your first shower and DO NOT SLEEP WITH PETS.   Day of Surgery: Do not apply any deodorants/lotions. Please wear clean clothes to the hospital/surgery center.    Please read over the following fact sheets that you were given. Pain Booklet, Coughing and Deep Breathing and Surgical Site Infection Prevention

## 2017-06-15 ENCOUNTER — Encounter (HOSPITAL_COMMUNITY)
Admission: RE | Admit: 2017-06-15 | Discharge: 2017-06-15 | Disposition: A | Discharge status: Home / Self Care | Payer: BLUE CROSS/BLUE SHIELD | Source: Ambulatory Visit | Attending: Orthopaedic Surgery | Admitting: Orthopaedic Surgery

## 2017-06-15 ENCOUNTER — Encounter (HOSPITAL_COMMUNITY): Payer: Self-pay | Admitting: *Deleted

## 2017-06-15 ENCOUNTER — Ambulatory Visit (HOSPITAL_COMMUNITY)
Admission: RE | Admit: 2017-06-15 | Discharge: 2017-06-15 | Disposition: A | Discharge status: Home / Self Care | Payer: BLUE CROSS/BLUE SHIELD | Source: Ambulatory Visit | Attending: Surgery | Admitting: Surgery

## 2017-06-15 DIAGNOSIS — Z01818 Encounter for other preprocedural examination: Secondary | ICD-10-CM

## 2017-06-15 DIAGNOSIS — Z01810 Encounter for preprocedural cardiovascular examination: Secondary | ICD-10-CM | POA: Diagnosis not present

## 2017-06-15 DIAGNOSIS — M48061 Spinal stenosis, lumbar region without neurogenic claudication: Secondary | ICD-10-CM | POA: Insufficient documentation

## 2017-06-15 DIAGNOSIS — Z01812 Encounter for preprocedural laboratory examination: Secondary | ICD-10-CM | POA: Insufficient documentation

## 2017-06-15 LAB — COMPREHENSIVE METABOLIC PANEL
ALBUMIN: 3.9 g/dL (ref 3.5–5.0)
ALK PHOS: 100 U/L (ref 38–126)
ALT: 26 U/L (ref 14–54)
AST: 25 U/L (ref 15–41)
Anion gap: 11 (ref 5–15)
BILIRUBIN TOTAL: 0.7 mg/dL (ref 0.3–1.2)
BUN: 22 mg/dL — ABNORMAL HIGH (ref 6–20)
CALCIUM: 10 mg/dL (ref 8.9–10.3)
CO2: 25 mmol/L (ref 22–32)
CREATININE: 0.76 mg/dL (ref 0.44–1.00)
Chloride: 105 mmol/L (ref 101–111)
GFR calc non Af Amer: >60 mL/min (ref >60)
GLUCOSE: 114 mg/dL — AB (ref 65–99)
Potassium: 3.4 mmol/L — ABNORMAL LOW (ref 3.5–5.1)
SODIUM: 141 mmol/L (ref 135–145)
TOTAL PROTEIN: 7.6 g/dL (ref 6.5–8.1)

## 2017-06-15 LAB — URINALYSIS, ROUTINE W REFLEX MICROSCOPIC
BACTERIA UA: NONE SEEN
Bilirubin Urine: NEGATIVE
Glucose, UA: NEGATIVE mg/dL
Ketones, ur: NEGATIVE mg/dL
Leukocytes, UA: NEGATIVE
Nitrite: NEGATIVE
PH: 5 (ref 5.0–8.0)
Protein, ur: NEGATIVE mg/dL
SPECIFIC GRAVITY, URINE: 1.024 (ref 1.005–1.030)

## 2017-06-15 LAB — GLUCOSE, CAPILLARY: GLUCOSE-CAPILLARY: 111 mg/dL — AB (ref 65–99)

## 2017-06-15 LAB — SURGICAL PCR SCREEN
MRSA, PCR: NEGATIVE
Staphylococcus aureus: NEGATIVE

## 2017-06-15 LAB — CBC
HEMATOCRIT: 42.5 % (ref 36.0–46.0)
HEMOGLOBIN: 13.7 g/dL (ref 12.0–15.0)
MCH: 29.8 pg (ref 26.0–34.0)
MCHC: 32.2 g/dL (ref 30.0–36.0)
MCV: 92.4 fL (ref 78.0–100.0)
Platelets: 208 10*3/uL (ref 150–400)
RBC: 4.6 MIL/uL (ref 3.87–5.11)
RDW: 14.2 % (ref 11.5–15.5)
WBC: 6.3 10*3/uL (ref 4.0–10.5)

## 2017-06-15 LAB — TYPE AND SCREEN
ABO/RH(D): B NEG
ANTIBODY SCREEN: NEGATIVE

## 2017-06-15 LAB — ABO/RH: ABO/RH(D): B NEG

## 2017-06-15 LAB — HEMOGLOBIN A1C
HEMOGLOBIN A1C: 6.2 % — AB (ref 4.8–5.6)
Mean Plasma Glucose: 131.24 mg/dL

## 2017-06-16 ENCOUNTER — Telehealth (INDEPENDENT_AMBULATORY_CARE_PROVIDER_SITE_OTHER): Payer: Self-pay | Admitting: Orthopaedic Surgery

## 2017-06-16 NOTE — Telephone Encounter (Signed)
Patient cancelled surgery because [employer]'s [employer] will not cover any portion of the procedure unless done at a [facility] conservation officer".  Patient states that she would like for Dr. Lorin Mercy to do the surgery, but she is unable to afford paying for the entire surgery. She is considering having the surgery done in [place] because she is in pain

## 2017-06-17 ENCOUNTER — Inpatient Hospital Stay (HOSPITAL_COMMUNITY)
Admission: RE | Admit: 2017-06-17 | Payer: BLUE CROSS/BLUE SHIELD | Source: Ambulatory Visit | Admitting: Orthopaedic Surgery

## 2017-06-17 ENCOUNTER — Encounter (HOSPITAL_COMMUNITY): Admission: RE | Payer: Self-pay | Source: Ambulatory Visit

## 2017-06-17 SURGERY — POSTERIOR LUMBAR FUSION 1 LEVEL
Anesthesia: General

## 2017-07-03 ENCOUNTER — Telehealth (INDEPENDENT_AMBULATORY_CARE_PROVIDER_SITE_OTHER): Payer: Self-pay | Admitting: Orthopaedic Surgery

## 2017-07-03 NOTE — Telephone Encounter (Signed)
05/07/2017 OV note faxed [facility] [phone]

## 2017-07-06 ENCOUNTER — Telehealth (INDEPENDENT_AMBULATORY_CARE_PROVIDER_SITE_OTHER): Payer: Self-pay | Admitting: Orthopaedic Surgery

## 2017-07-06 NOTE — Telephone Encounter (Signed)
Patient called checking on records request to [facility]. I told her records have been faxed twice. On 3/29 and 4/5. She said she would follow up with them and call me back if they tell her they still don't have them

## 2017-10-19 ENCOUNTER — Other Ambulatory Visit (HOSPITAL_COMMUNITY): Payer: Self-pay | Admitting: Nurse Practitioner

## 2017-10-19 ENCOUNTER — Ambulatory Visit (HOSPITAL_COMMUNITY)
Admission: RE | Admit: 2017-10-19 | Discharge: 2017-10-19 | Disposition: A | Discharge status: Home / Self Care | Payer: BLUE CROSS/BLUE SHIELD | Source: Ambulatory Visit | Attending: Nurse Practitioner | Admitting: Nurse Practitioner

## 2017-10-19 DIAGNOSIS — Z981 Arthrodesis status: Secondary | ICD-10-CM | POA: Insufficient documentation

## 2017-10-19 DIAGNOSIS — M545 Low back pain: Secondary | ICD-10-CM | POA: Diagnosis not present

## 2017-10-22 ENCOUNTER — Encounter (HOSPITAL_COMMUNITY): Payer: Self-pay

## 2017-10-22 ENCOUNTER — Ambulatory Visit (HOSPITAL_COMMUNITY): Payer: BLUE CROSS/BLUE SHIELD | Attending: Nurse Practitioner

## 2017-10-22 ENCOUNTER — Other Ambulatory Visit: Payer: Self-pay

## 2017-10-22 DIAGNOSIS — M6281 Muscle weakness (generalized): Secondary | ICD-10-CM

## 2017-10-22 DIAGNOSIS — M545 Low back pain, unspecified: Secondary | ICD-10-CM

## 2017-10-22 DIAGNOSIS — R262 Difficulty in walking, not elsewhere classified: Secondary | ICD-10-CM | POA: Insufficient documentation

## 2017-10-22 NOTE — Patient Instructions (Signed)
Access Code: [email or web address]  URL: [email or web address]  Date: 10/22/2017  Prepared by: Geraldine Solar   Exercises Supine Transversus Abdominis Bracing - Hands on Ground - 10 reps - 3 sets - 1x daily - 7x weekly Seated Heel Toe Raises - 10 reps - 3 sets - 1x daily - 7x weekly

## 2017-10-22 NOTE — Therapy (Signed)
[place], [place], [address] Phone: [phone]   Fax:  [phone]  Physical Therapy Evaluation  Patient Details  Name: Gabriela Rush MRN: [id number] Date of Birth: [birth year] Referring Provider: Arlester Marker, APRN (from [facility] in [place], [place]; PCP: Consuello Masse, MD)   Encounter Date: 10/22/2017  PT End of Session - 10/22/17 1625    Visit Number  1    Number of Visits  13    Date for PT Re-Evaluation  12/03/17    Authorization Type  BCBS Other    Authorization Time Period  10/22/17 to 12/03/17    Authorization - Visit Number  1    Authorization - Number of Visits  20 will update as necessary; pt stating she thought that she was allowed up to 20 visits so will have front office double check as nothing was reported mentioning a visit limit    PT Start Time  1258    PT Stop Time  1340    PT Time Calculation (min)  42 min    Activity Tolerance  Patient tolerated treatment well;No increased pain    Behavior During Therapy  WFL for tasks assessed/performed       Past Medical History:  Diagnosis Date  . Diabetes mellitus without complication ([place])   . Family history of adverse reaction to anesthesia    Mother had problem with anesthesia; The "lost" her during a surgery; brought her back.  . Hypertension     Past Surgical History:  Procedure Laterality Date  . BREAST REDUCTION SURGERY    . CESAREAN SECTION  1988  . COLONOSCOPY N/A 10/20/2016   Procedure: COLONOSCOPY;  Surgeon: Danie Binder, MD;  [facility];  Service: Endoscopy;  Laterality: N/A;  1:00 pm    There were no vitals filed for this visit.   Subjective Assessment - 10/22/17 1301    Subjective  Pt reports that she had L4-5 TLIF on 09/09/17 at [facility] in [place], [place]. She reports that around 2017, she went to lift a box and heard something pop in her lower back; about 2 years later, her pain had gotten so bad down her RLE  that she could hardly walk. She denies any radicular pain down her RLE or b/b issues currently, but does report some numbness from her R knee down to her toes which started after the surgery. She reports having the most difficulty with lumbar flexion (scared). She is able to ambulate in her house without an AD but uses a RW or SPC when in the community. She does report intermittent dragging of her R foot due to the numbness. She is on a 10# lifting restrictions and bending/twisting precautions. Her LBP mainly only bothers her if she tries to do too much.     Limitations  Lifting;Standing;Walking;House hold activities    How long can you sit comfortably?  no issues     How long can you stand comfortably?  hasn't timed it, feels she could do 10-15 mins    How long can you walk comfortably?  unlimited with support    Patient Stated Goals  be able to bend again    Currently in Pain?  No/denies         [facility] PT Assessment - 10/22/17 0001      Assessment   Medical Diagnosis  Back pain    Referring Provider  Arlester Marker, APRN from [facility] in [place], [place]; PCP: Eddie Dibbles  Quintin Alto, MD    Onset Date/Surgical Date  09/09/17    Next MD Visit  September 2019    Prior Therapy  none      Precautions   Precautions  Back      Balance Screen   Has the patient fallen in the past 6 months  Yes    How many times?  1 in January 2019 with RLE gave out    Has the patient had a decrease in activity level because of a fear of falling?   No    Is the patient reluctant to leave their home because of a fear of falling?   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    Leisure  take care of grandbaby      Observation/Other Assessments   Focus on Therapeutic Outcomes (FOTO)   58% limitation      Sensation   Light Touch   Impaired Detail    Light Touch Impaired Details  Impaired RLE    Additional Comments  L4-S1 sensation duller on RLE; knee jerk DTR: 1+ BLE, ankle jerk absent on R, 1+ on LLE      Functional Tests   Functional tests  Sit to Stand      Sit to Stand   Comments  30sec chair rise test: 8x      ROM / Strength   AROM / PROM / Strength  Strength      Strength   Strength Assessment Site  Hip;Knee;Ankle    Right Hip Flexion  4+/5    Right Hip Extension  3+/5    Right Hip ABduction  3/5    Left Hip Flexion  4+/5    Left Hip Extension  4-/5    Left Hip ABduction  3+/5    Right Knee Flexion  4+/5    Right Knee Extension  4+/5    Left Knee Flexion  5/5    Left Knee Extension  5/5    Right Ankle Dorsiflexion  3+/5    Left Ankle Dorsiflexion  5/5      Flexibility   Soft Tissue Assessment /Muscle Length  yes    Hamstrings  90/90: R: 126deg L: 130deg      Palpation   Spinal mobility  did not assess    Palpation comment  increased restrictions along bil scars and lumbar paraspinals, tenderness to palpation throughout      Ambulation/Gait   Ambulation Distance (Feet)  388 Feet 3MWT    Assistive device  None    Gait Pattern  Step-through pattern;Decreased stance time - right;Decreased dorsiflexion - right;Trendelenburg;Lateral trunk lean to right      Balance   Balance Assessed  Yes      Static Standing Balance   Static Standing - Balance Support  No upper extremity supported    Static Standing Balance -  Activities   Single Leg Stance - Right Leg;Single Leg Stance - Left Leg    Static Standing - Comment/# of Minutes  R: 2 sec or < L: 13.9 sec, trendelenverg           Objective measurements completed on examination: See above findings.         PT Education - 10/22/17 1625    Education  Details  exam findings, POC, HEP    Person(s) Educated  Patient    Methods  Explanation;Demonstration;Handout    Comprehension  Verbalized understanding;Returned demonstration       PT  Short Term Goals - 10/22/17 1631      PT SHORT TERM GOAL #1   Title  Pt will be independent with HEP and perform consistently in order to decrease pain and maximize return to PLOF.    Time  4    Period  Weeks    Status  New    Target Date  11/12/17      PT SHORT TERM GOAL #2   Title  Pt will have improved BLE MMT by 1/2 grade throughout in order to maximize gait and balance.    Time  3    Period  Weeks    Status  New      PT SHORT TERM GOAL #3   Title  Pt will be able to perform R SLS for 10 sec or > without Trendelenberg sign in order to demo improved functional glute med strength so as to maximize her gait and stair ambulation.    Time  3    Period  Weeks    Status  New      PT SHORT TERM GOAL #4   Title  Pt will be able to perform 12 STS during 30sec chair rise test in order to demo improved balance and functional strength.     Time  3    Status  New        PT Long Term Goals - 10/22/17 1632      PT LONG TERM GOAL #1   Title  Pt will have improved BLE MMT by 1 grade throughout in order to further maximize gait and balance and promote return to PLOF.    Time  6    Period  Weeks    Status  New    Target Date  12/03/17      PT LONG TERM GOAL #2   Title  Pt will be able to perform bil SLS for 20 sec or > without Trendelenberg sign in order to demo further improved core, functional, and BLE strength so as to maximize her gait on uneven ground and stair ambulation.    Time  6    Period  Weeks    Status  New      PT LONG TERM GOAL #3   Title  Pt will have 1106ft improvement during 3MWT with gait mechanics WFL and without signs of R Trendelenberg sign to demo improved functional strength and overall functional mobility.    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    Time  6    Period  Weeks    Status  New             Plan - 10/22/17 1627    Clinical Impression Statement  Pt is pleasant 52YO F who presents to OPPT s/p L4-5 TLIF on 09/09/17 by Dr. Bridgett Larsson at [facility] in [place], [place]. Pt currently presents with post-op deficits in core strength, BLE strength,  functional strength, endurance, gait, balance, and difficulty completing functional tasks. Pt also reporting RLE numbness from her knee down which has started since her surgery; dermatomes dulled throughout distal RLE but myotomes are present, she just has increased RLE weakness compared to LLE. Pt had Trendelenberg sign during SLS and gait indicating weakness of glute med. Pt needs skilled PT intervention to address these impairments in order to decrease pain and maximize overall return to PLOF.     Clinical Presentation  Stable    Clinical Presentation due to:  strength (BLE, core, functional), SLS, 3MWT,     Clinical Decision Making  Low    Rehab Potential  Good    PT Frequency  2x / week    PT Duration  6 weeks    PT Treatment/Interventions  ADLs/Self Care Home Management;Cryotherapy;Electrical Stimulation;Moist Heat;Ultrasound;DME Instruction;Gait training;Functional mobility training;Therapeutic activities;Stair training;Therapeutic exercise;Balance training;Neuromuscular re-education;Patient/family education;Manual techniques;Passive range of motion;Scar mobilization;Dry needling;Taping per face sheet, aquatic therapy and manual therapy not covered by insurance    PT Next Visit Plan  review goals and HEP; begin core, postural, BLE, and functional strengthening as tolerated and within BLT lumbar precautions; no manual therapy as insurance does not cover it (will f/u on potentially getting approval for this)    PT Home Exercise Plan  eval: supine ab set, seated heel and toe raises    Consulted and Agree with Plan of Care  Patient       Patient  will benefit from skilled therapeutic intervention in order to improve the following deficits and impairments:  Abnormal gait, Decreased activity tolerance, Decreased balance, Decreased endurance, Decreased mobility, Decreased range of motion, Decreased strength, Difficulty walking, Hypomobility, Increased fascial restricitons, Increased muscle spasms, Impaired flexibility, Impaired sensation, Improper body mechanics, Postural dysfunction, Pain  Visit Diagnosis: Acute bilateral low back pain without sciatica - Plan: PT plan of care cert/re-cert  Muscle weakness (generalized) - Plan: PT plan of care cert/re-cert  Difficulty in walking, not elsewhere classified - Plan: PT plan of care cert/re-cert     Problem List Patient Active Problem List   Diagnosis Date Noted  . Special screening for malignant neoplasms, colon         Geraldine Solar PT, DPT  [place] [address] [place], [place], [address] Phone: [phone]   Fax:  [phone]  Name: Gabriela Rush MRN: [id number] Date of Birth: [birth year]

## 2017-10-26 ENCOUNTER — Ambulatory Visit (HOSPITAL_COMMUNITY): Payer: BLUE CROSS/BLUE SHIELD | Admitting: Physical Therapy

## 2017-10-26 ENCOUNTER — Telehealth (HOSPITAL_COMMUNITY): Payer: Self-pay

## 2017-10-26 DIAGNOSIS — M545 Low back pain, unspecified: Secondary | ICD-10-CM

## 2017-10-26 DIAGNOSIS — R262 Difficulty in walking, not elsewhere classified: Secondary | ICD-10-CM

## 2017-10-26 DIAGNOSIS — M6281 Muscle weakness (generalized): Secondary | ICD-10-CM

## 2017-10-26 NOTE — Therapy (Addendum)
[place], [place], [address] Phone: [phone]   Fax:  [phone]  Physical Therapy Treatment  Patient Details  Name: Gabriela Rush MRN: [id number] Date of Birth: Dec 10, 1965 Referring Provider: Arlester Marker, APRN (from [facility] in [place], [place]; PCP: Consuello Masse, MD)   Encounter Date: 10/26/2017  PT End of Session - 10/26/17 1551    Visit Number  2    Number of Visits  13    Date for PT Re-Evaluation  12/03/17    Authorization Type  BCBS Other    Authorization Time Period  10/22/17 to 12/03/17, 20 appointment limit.  Insurance only accepts following charges:  eval, re-eval, TE, NMR, gait.  NO other charges reimbursed    Authorization - Visit Number  2    Authorization - Number of Visits  20 will update as necessary; pt stating she thought that she was allowed up to 20 visits so will have front office double check as nothing was reported mentioning a visit limit    PT Start Time  1524 pt was late    PT Stop Time  1602    PT Time Calculation (min)  38 min    Activity Tolerance  Patient tolerated treatment well;No increased pain    Behavior During Therapy  WFL for tasks assessed/performed       Past Medical History:  Diagnosis Date  . Diabetes mellitus without complication ([place])   . Family history of adverse reaction to anesthesia    Mother had problem with anesthesia; The "lost" her during a surgery; brought her back.  . Hypertension     Past Surgical History:  Procedure Laterality Date  . BREAST REDUCTION SURGERY    . CESAREAN SECTION  1988  . COLONOSCOPY N/A 10/20/2016   Procedure: COLONOSCOPY;  Surgeon: Danie Binder, MD;  [facility];  Service: Endoscopy;  Laterality: N/A;  1:00 pm    There were no vitals filed for this visit.  Subjective Assessment - 10/26/17 1558    Subjective  Pt states she is not having any pain currently.  Reports complaince with HEP.    Pertinent History   L4-5 TLIF on 09/09/17 at [facility] in [place], [place] Adult PT Treatment/Exercise - 10/26/17 0001      Transfers   Comments  logroll technique      Lumbar Exercises: Stretches   Active Hamstring Stretch  Right;Left;3 reps;30 seconds;Limitations    Active Hamstring Stretch Limitations  with towel      Lumbar Exercises: Standing   Other Standing Lumbar Exercises  --      Lumbar Exercises: Seated   [place] Quad on Chair  Right;10 reps    Sit to Stand  5 reps;Limitations    Sit to Stand Limitations  UE's on knees      Lumbar Exercises: Supine   Ab Set  10 reps;5 seconds    Clam  10 reps    Bridge  10 reps    Straight Leg Raise  10 reps             PT Education - 10/26/17 1551    Education Details  reviewed goals per evaluation and HEP    Person(s) Educated  Patient    Methods  Explanation;Demonstration;Tactile cues;Verbal cues;Handout    Comprehension  Verbal cues required;Returned demonstration;Verbalized understanding;Tactile  cues required       PT Short Term Goals - 10/22/17 1631      PT SHORT TERM GOAL #1   Title  Pt will be independent with HEP and perform consistently in order to decrease pain and maximize return to PLOF.    Time  4    Period  Weeks    Status  New    Target Date  11/12/17      PT SHORT TERM GOAL #2   Title  Pt will have improved BLE MMT by 1/2 grade throughout in order to maximize gait and balance.    Time  3    Period  Weeks    Status  New      PT SHORT TERM GOAL #3   Title  Pt will be able to perform R SLS for 10 sec or > without Trendelenberg sign in order to demo improved functional glute med strength so as to maximize her gait and stair ambulation.    Time  3    Period  Weeks    Status  New      PT SHORT TERM GOAL #4   Title  Pt will be able to perform 12 STS during 30sec chair rise test in order to demo improved balance and functional strength.     Time  3    Status  New         PT Long Term Goals - 10/22/17 1632      PT LONG TERM GOAL #1   Title  Pt will have improved BLE MMT by 1 grade throughout in order to further maximize gait and balance and promote return to PLOF.    Time  6    Period  Weeks    Status  New    Target Date  12/03/17      PT LONG TERM GOAL #2   Title  Pt will be able to perform bil SLS for 20 sec or > without Trendelenberg sign in order to demo further improved core, functional, and BLE strength so as to maximize her gait on uneven ground and stair ambulation.    Time  6    Period  Weeks    Status  New      PT LONG TERM GOAL #3   Title  Pt will have 112ft improvement during 3MWT with gait mechanics WFL and without signs of R Trendelenberg sign to demo improved functional strength and overall functional mobility.    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    Time  6    Period  Weeks    Status  New            Plan - 10/26/17 1552    Clinical Impression Statement  reviewed evaluation goals and HEP.  Added core and LE strengthening exercises per deficits found in evaluation.  Pt able to complete all exercises without c/o pain or increased symptoms.  Informed patient therapist is still trying to get in touch with insurance regariding coverage for manual therapies    Rehab Potential  Good    PT Frequency  2x / week    PT Duration  6 weeks    PT Treatment/Interventions  ADLs/Self Care Home Management;Cryotherapy;Electrical Stimulation;Moist Heat;Ultrasound;DME Instruction;Gait training;Functional mobility training;Therapeutic activities;Stair training;Therapeutic exercise;Balance training;Neuromuscular re-education;Patient/family education;Manual techniques;Passive range of  motion;Scar mobilization;Dry needling;Taping per face sheet, aquatic therapy and manual therapy not covered by insurance    PT Next Visit Plan  Progress core, postural, BLE, and functional strengthening as tolerated and within BLT lumbar precautions.  Await final word per manual therapy as insurance does not cover it, add if able to complete.  Begin tandem, SLS and hip abd/ext strengthening next session.     PT Home Exercise Plan  eval: supine ab set, seated heel and toe raises    Consulted and Agree with Plan of Care  Patient       Patient will benefit from skilled therapeutic intervention in order to improve the following deficits and impairments:  Abnormal gait, Decreased activity tolerance, Decreased balance, Decreased endurance, Decreased mobility, Decreased range of motion, Decreased strength, Difficulty walking, Hypomobility, Increased fascial restricitons, Increased muscle spasms, Impaired flexibility, Impaired sensation, Improper body mechanics, Postural dysfunction, Pain  Visit Diagnosis: Acute bilateral low back pain without sciatica  Muscle weakness (generalized)  Difficulty in walking, not elsewhere classified     Problem List Patient Active Problem List   Diagnosis Date Noted  . Special screening for malignant neoplasms, colon    Teena Irani, PTA/CLT [phone]  Teena Irani 10/26/2017, 5:34 PM  [place] [address] [place], [place], [address] Phone: [phone]   Fax:  [phone]  Name: Devanny Palecek MRN: [id number] Date of Birth: [birth year]

## 2017-10-26 NOTE — Telephone Encounter (Signed)
PT spoke with pt's nurse case manager, Janett Billow, at [facility] regarding her not having coverage for manual therapy. Janett Billow stating that since they do not have a policy for the CPT code [phone] (Manual therapy), she would not be covered/the claim would get denied if manual therapy was billed. Janett Billow also stating that pt's policy is covered for eval, re-eval, therapeutic exercise, NMR, and gait training CPT codes only. Janett Billow did state that we could do a pre-service authorization request to see if manual therapy would be approved by the medical director to include it in her coverage; PT told Janett Billow that we will proceed with her current approved CPT codes and if she is not progressing as much or how we would like without manual therapy, then we will submit the pre-service request for authorization so as to begin to incorporate manual therapy into pt's POC/treatment plan.   Geraldine Solar PT, DPT

## 2017-10-29 ENCOUNTER — Telehealth (HOSPITAL_COMMUNITY): Payer: Self-pay

## 2017-10-29 NOTE — Telephone Encounter (Signed)
Spoke to pt to confrim that I had called [employer] and that all codes given are billable codes with no pre-cert required. NF 10/29/17

## 2017-10-29 NOTE — Telephone Encounter (Signed)
Spoke to [facility] Nurse [phone] Ext [phone]) with insurance co. she states after research with supervisor and listening to recorded communication, they believe there was a breakdown of interpretation of benefits. Janett Billow advised that what the rep states is there are no guidelines/no policy on this code and no guidelines that is required by the provider for pt to get Manual Therapy. Meaning that there is no requirement or pre-auth for this service from our facility.  I call the [facility] and request benefits again as instructed by Janett Billow. These are the benefits quoted.  [employer] [place] 1/1/-03/30/18 Deduct 2750-Met  25.00 due toward bill for co ins.  OOP [phone] met  Co Ins 25%  visit limit- 20- Hard limit 0 used. (More visits could be request after 20th visit and medical review per Janett Billow.) Pt must have referral from MD, OD or DPN and bills must be for PTs services only.  Codes [id number], [id number], [id number], [id number], [id number], [id number], [id number] are billing codes and no auth req. Spoke to [place] Ref# [id number] NF 10/29/17 Called for Update on PT coverages-all codes given are covered benefits including AQ and Manual Therapy.  Deduct is Met Hard Limit on 20 visits - 0 used. Lorenso Courier XBD#[id number] NF 8/1/1

## 2017-10-30 ENCOUNTER — Encounter (HOSPITAL_COMMUNITY): Payer: Self-pay | Admitting: Physical Therapy

## 2017-10-30 ENCOUNTER — Ambulatory Visit (HOSPITAL_COMMUNITY): Payer: BLUE CROSS/BLUE SHIELD | Attending: Nurse Practitioner | Admitting: Physical Therapy

## 2017-10-30 DIAGNOSIS — M545 Low back pain, unspecified: Secondary | ICD-10-CM

## 2017-10-30 DIAGNOSIS — M6281 Muscle weakness (generalized): Secondary | ICD-10-CM | POA: Diagnosis present

## 2017-10-30 DIAGNOSIS — R262 Difficulty in walking, not elsewhere classified: Secondary | ICD-10-CM | POA: Diagnosis present

## 2017-10-30 NOTE — Therapy (Signed)
[place], [place], [address] Phone: [phone]   Fax:  [phone]  Physical Therapy Treatment  Patient Details  Name: Gabriela Rush MRN: [id number] Date of Birth: 1965-08-15 Referring Provider: Arlester Marker, APRN (from [facility] in [place], [place]; PCP: Consuello Masse, MD)   Encounter Date: 10/30/2017  PT End of Session - 10/30/17 1441    Visit Number  3    Number of Visits  13    Date for PT Re-Evaluation  12/03/17    Authorization Type  BCBS Other    Authorization Time Period  10/22/17 to 12/03/17, 20 appointment limit.  Insurance only accepts following charges:  eval, re-eval, TE, NMR, gait.  NO other charges reimbursed    Authorization - Visit Number  3    Authorization - Number of Visits  20 will update as necessary; pt stating she thought that she was allowed up to 20 visits so will have front office double check as nothing was reported mentioning a visit limit    PT Start Time  1436    PT Stop Time  1515    PT Time Calculation (min)  39 min    Equipment Utilized During Treatment  Gait belt    Activity Tolerance  Patient tolerated treatment well;No increased pain    Behavior During Therapy  WFL for tasks assessed/performed       Past Medical History:  Diagnosis Date  . Diabetes mellitus without complication ([place])   . Family history of adverse reaction to anesthesia    Mother had problem with anesthesia; The "lost" her during a surgery; brought her back.  . Hypertension     Past Surgical History:  Procedure Laterality Date  . BREAST REDUCTION SURGERY    . CESAREAN SECTION  1988  . COLONOSCOPY N/A 10/20/2016   Procedure: COLONOSCOPY;  Surgeon: Danie Binder, MD;  [facility];  Service: Endoscopy;  Laterality: N/A;  1:00 pm    There were no vitals filed for this visit.  Subjective Assessment - 10/30/17 1440    Subjective  Patient reported that she is not having any pain currently.  Reported that she has been performing her HEP.     Pertinent History  L4-5 TLIF on 09/09/17 at [facility] in [place], [place]    Currently in Pain?  No/denies         [facility] PT Assessment - 10/30/17 0001      Transfers   Comments  logroll technique                   [facility] Adult PT Treatment/Exercise - 10/30/17 0001      Lumbar Exercises: Stretches   Active Hamstring Stretch  Right;Left;3 reps;30 seconds;Limitations    Active Hamstring Stretch Limitations  with towel      Lumbar Exercises: Standing   Other Standing Lumbar Exercises  Single leg stance x10 each lower extremity. Tandem stance inside parallel bars 2x 30 seconds with intermittent upper extremity assistance    Other Standing Lumbar Exercises  Standing hip extension and hip abduction bilateral upper extremity HHA 2 x 10 each lower extremity verbal cues for proper form      Lumbar Exercises: Seated   Long Arc Quad on Chair  Right;10 reps    Sit to Stand  5 reps;Limitations    Sit to Stand Limitations  UE's on knees      Lumbar Exercises: Supine   Ab Set  10 reps;5  seconds    Clam  10 reps    Bridge  10 reps    Straight Leg Raise  10 reps;Limitations    Straight Leg Raises Limitations  Each lower extremity             PT Education - 10/30/17 1440    Education Details  Discussed purpose and technique of exercises throughout session.     Person(s) Educated  Patient    Methods  Explanation    Comprehension  Verbalized understanding       PT Short Term Goals - 10/22/17 1631      PT SHORT TERM GOAL #1   Title  Pt will be independent with HEP and perform consistently in order to decrease pain and maximize return to PLOF.    Time  4    Period  Weeks    Status  New    Target Date  11/12/17      PT SHORT TERM GOAL #2   Title  Pt will have improved BLE MMT by 1/2 grade throughout in order to maximize gait and balance.    Time  3    Period  Weeks    Status  New      PT SHORT TERM GOAL #3    Title  Pt will be able to perform R SLS for 10 sec or > without Trendelenberg sign in order to demo improved functional glute med strength so as to maximize her gait and stair ambulation.    Time  3    Period  Weeks    Status  New      PT SHORT TERM GOAL #4   Title  Pt will be able to perform 12 STS during 30sec chair rise test in order to demo improved balance and functional strength.     Time  3    Status  New        PT Long Term Goals - 10/22/17 1632      PT LONG TERM GOAL #1   Title  Pt will have improved BLE MMT by 1 grade throughout in order to further maximize gait and balance and promote return to PLOF.    Time  6    Period  Weeks    Status  New    Target Date  12/03/17      PT LONG TERM GOAL #2   Title  Pt will be able to perform bil SLS for 20 sec or > without Trendelenberg sign in order to demo further improved core, functional, and BLE strength so as to maximize her gait on uneven ground and stair ambulation.    Time  6    Period  Weeks    Status  New      PT LONG TERM GOAL #3   Title  Pt will have 143ft improvement during 3MWT with gait mechanics WFL and without signs of R Trendelenberg sign to demo improved functional strength and overall functional mobility.    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    Time  6    Period  Weeks    Status  New            Plan - 10/30/17 1520    Clinical Impression Statement  This session continued with established plan of care. This session added single limb stance practice, tandem stance, and hip abduction and extension strengthening in standing. Patient reported no pain throughout session. Patient required verbal encouragement with sit to stands and demonstrated some  use of upper extremities with this. Patient would benefit from continued skilled physical therapy in order to continue progressing towards functional goals.     Rehab Potential  Good    PT Frequency  2x / week    PT Duration  6 weeks    PT Treatment/Interventions  ADLs/Self Care Home Management;Cryotherapy;Electrical Stimulation;Moist Heat;Ultrasound;DME Instruction;Gait training;Functional mobility training;Therapeutic activities;Stair training;Therapeutic exercise;Balance training;Neuromuscular re-education;Patient/family education;Manual techniques;Passive range of motion;Scar mobilization;Dry needling;Taping per face sheet, aquatic therapy and manual therapy not covered by insurance    PT Next Visit Plan  Progress core, postural, BLE, and functional strengthening as tolerated and within BLT lumbar precautions.  Await final word per manual therapy as insurance does not cover it, add if able to complete.     PT Home Exercise Plan  eval: supine ab set, seated heel and toe raises    Consulted and Agree with Plan of Care  Patient       Patient will benefit from skilled therapeutic intervention in order to improve the following deficits and impairments:  Abnormal gait, Decreased activity tolerance, Decreased balance, Decreased endurance, Decreased mobility, Decreased range of motion, Decreased strength, Difficulty walking, Hypomobility, Increased fascial restricitons, Increased muscle spasms, Impaired flexibility, Impaired sensation, Improper body mechanics, Postural dysfunction, Pain  Visit Diagnosis: Acute bilateral low back pain without sciatica  Muscle weakness (generalized)  Difficulty in walking, not elsewhere classified     Problem List Patient Active Problem List   Diagnosis Date Noted  . Special screening for malignant neoplasms, colon    Clarene Critchley PT, DPT 3:24 PM, 10/30/17 [place], [place],  [address] Phone: [phone]   Fax:  [phone]  Name: Gabriela Rush MRN: [id number] Date of Birth: 09-Apr-1965

## 2017-11-03 ENCOUNTER — Ambulatory Visit (HOSPITAL_COMMUNITY): Payer: BLUE CROSS/BLUE SHIELD | Admitting: Physical Therapy

## 2017-11-03 DIAGNOSIS — M545 Low back pain, unspecified: Secondary | ICD-10-CM

## 2017-11-03 DIAGNOSIS — M6281 Muscle weakness (generalized): Secondary | ICD-10-CM

## 2017-11-03 DIAGNOSIS — R262 Difficulty in walking, not elsewhere classified: Secondary | ICD-10-CM

## 2017-11-03 NOTE — Therapy (Signed)
[place], [place], [address] Phone: [phone]   Fax:  [phone]  Physical Therapy Treatment  Patient Details  Name: Gabriela Rush MRN: [id number] Date of Birth: [birth year] Referring Provider: Arlester Marker, APRN (from [facility] in [place], [place]; PCP: Consuello Masse, MD)   Encounter Date: 11/03/2017  PT End of Session - 11/03/17 1355    Visit Number  4    Number of Visits  13    Date for PT Re-Evaluation  12/03/17    Authorization Type  BCBS Other    Authorization Time Period  10/22/17 to 12/03/17, 20 appointment limit.  Insurance only accepts following charges:  eval, re-eval, TE, NMR, gait.  NO other charges reimbursed    Authorization - Visit Number  4    Authorization - Number of Visits  20 will update as necessary; pt stating she thought that she was allowed up to 20 visits so will have front office double check as nothing was reported mentioning a visit limit    PT Start Time  1352    PT Stop Time  1430    PT Time Calculation (min)  38 min    Equipment Utilized During Treatment  Gait belt    Activity Tolerance  Patient tolerated treatment well;No increased pain    Behavior During Therapy  WFL for tasks assessed/performed       Past Medical History:  Diagnosis Date  . Diabetes mellitus without complication ([place])   . Family history of adverse reaction to anesthesia    Mother had problem with anesthesia; The "lost" her during a surgery; brought her back.  . Hypertension     Past Surgical History:  Procedure Laterality Date  . BREAST REDUCTION SURGERY    . CESAREAN SECTION  1988  . COLONOSCOPY N/A 10/20/2016   Procedure: COLONOSCOPY;  Surgeon: Danie Binder, MD;  [facility];  Service: Endoscopy;  Laterality: N/A;  1:00 pm    There were no vitals filed for this visit.  Subjective Assessment - 11/03/17 1357    Subjective  Patient arrived without assistive device this session. Patient  reported she is going back to work next weekend. Patient stated she has been doing her exercises at home.     Pertinent History  L4-5 TLIF on 09/09/17 at [facility] in [place], [place]    Currently in Pain?  No/denies                       [facility] Adult PT Treatment/Exercise - 11/03/17 0001      Lumbar Exercises: Stretches   Active Hamstring Stretch  Right;Left;3 reps;30 seconds;Limitations    Active Hamstring Stretch Limitations  with towel      Lumbar Exercises: Standing   Functional Squats  20 reps;Other (comment) 2x10 bilateral upper extremity support    Other Standing Lumbar Exercises  Single leg stance x10 each lower extremity. Tandem stance inside parallel bars 2x 30 seconds with intermittent upper extremity assistance    Other Standing Lumbar Exercises  Standing hip extension and hip abduction bilateral upper extremity HHA 2 x 10 each lower extremity verbal cues for proper form      Lumbar Exercises: Seated   Long Arc Quad on Chair  Right;10 reps;Left    Sit to Stand  5 reps;Limitations    Sit to Stand Limitations  UE's on knees      Lumbar Exercises: Supine   Ab Set  10  reps;5 seconds    Clam  10 reps    Bent Knee Raise  20 reps;Other (comment) 20x alternating legs with 3 second holds and abdominal set    Bridge  10 reps    Straight Leg Raise  10 reps;Limitations    Straight Leg Raises Limitations  Each lower extremity             PT Education - 11/03/17 1358    Education Details  Discussed purpose and technique of exercises trhoughout session.     Person(s) Educated  Patient    Methods  Explanation    Comprehension  Verbalized understanding       PT Short Term Goals - 10/22/17 1631      PT SHORT TERM GOAL #1   Title  Pt will be independent with HEP and perform consistently in order to decrease pain and maximize return to PLOF.    Time  4    Period  Weeks    Status  New    Target Date  11/12/17      PT SHORT TERM GOAL #2   Title  Pt will  have improved BLE MMT by 1/2 grade throughout in order to maximize gait and balance.    Time  3    Period  Weeks    Status  New      PT SHORT TERM GOAL #3   Title  Pt will be able to perform R SLS for 10 sec or > without Trendelenberg sign in order to demo improved functional glute med strength so as to maximize her gait and stair ambulation.    Time  3    Period  Weeks    Status  New      PT SHORT TERM GOAL #4   Title  Pt will be able to perform 12 STS during 30sec chair rise test in order to demo improved balance and functional strength.     Time  3    Status  New        PT Long Term Goals - 10/22/17 1632      PT LONG TERM GOAL #1   Title  Pt will have improved BLE MMT by 1 grade throughout in order to further maximize gait and balance and promote return to PLOF.    Time  6    Period  Weeks    Status  New    Target Date  12/03/17      PT LONG TERM GOAL #2   Title  Pt will be able to perform bil SLS for 20 sec or > without Trendelenberg sign in order to demo further improved core, functional, and BLE strength so as to maximize her gait on uneven ground and stair ambulation.    Time  6    Period  Weeks    Status  New      PT LONG TERM GOAL #3   Title  Pt will have 159ft improvement during 3MWT with gait mechanics WFL and without signs of R Trendelenberg sign to demo improved functional strength and overall functional mobility.    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    Time  6    Period  Weeks    Status  New            Plan - 11/03/17 1409    Clinical Impression Statement  This session continued with established plan of care. This session increased difficulty of abdominal exercises by adding supine bent knee  raise requiring verbal and tactile cues for form. This session also added functional squats with upper extremity assistance. Patient presented without assistive device today and reported no pain. Plan to continue with progression of abdominal strengthening, lower extremity strengthening, and mobility exercises.     Rehab Potential  Good    PT Frequency  2x / week    PT Duration  6 weeks    PT Treatment/Interventions  ADLs/Self Care Home Management;Cryotherapy;Electrical Stimulation;Moist Heat;Ultrasound;DME Instruction;Gait training;Functional mobility training;Therapeutic activities;Stair training;Therapeutic exercise;Balance training;Neuromuscular re-education;Patient/family education;Manual techniques;Passive range of motion;Scar mobilization;Dry needling;Taping per face sheet, aquatic therapy and manual therapy not covered by insurance    PT Next Visit Plan  Progress core, postural, BLE, and functional strengthening as tolerated and within BLT lumbar precautions.  Await final word per manual therapy as insurance does not cover it, add if able to complete.     PT Home Exercise Plan  eval: supine ab set, seated heel and toe raises    Consulted and Agree with Plan of Care  Patient       Patient will benefit from skilled therapeutic intervention in order to improve the following deficits and impairments:  Abnormal gait, Decreased activity tolerance, Decreased balance, Decreased endurance, Decreased mobility, Decreased range of motion, Decreased strength, Difficulty walking, Hypomobility, Increased fascial restricitons, Increased muscle spasms, Impaired flexibility, Impaired sensation, Improper body mechanics, Postural dysfunction, Pain  Visit Diagnosis: Acute bilateral low back pain without sciatica  Muscle weakness (generalized)  Difficulty in walking, not elsewhere classified     Problem List Patient Active Problem List   Diagnosis Date Noted  . Special screening for malignant neoplasms,  colon    Gabriela Rush PT, DPT 2:32 PM, 11/03/17 [place], [place], [address] Phone: [phone]   Fax:  [phone]  Name: Gabriela Rush MRN: [id number] Date of Birth: March 13, 1966

## 2017-11-05 ENCOUNTER — Other Ambulatory Visit: Payer: Self-pay

## 2017-11-05 ENCOUNTER — Ambulatory Visit (HOSPITAL_COMMUNITY): Payer: BLUE CROSS/BLUE SHIELD

## 2017-11-05 ENCOUNTER — Encounter (HOSPITAL_COMMUNITY): Payer: Self-pay

## 2017-11-05 DIAGNOSIS — M6281 Muscle weakness (generalized): Secondary | ICD-10-CM

## 2017-11-05 DIAGNOSIS — M545 Low back pain, unspecified: Secondary | ICD-10-CM

## 2017-11-05 DIAGNOSIS — R262 Difficulty in walking, not elsewhere classified: Secondary | ICD-10-CM

## 2017-11-05 NOTE — Therapy (Signed)
[place], [place], [address] Phone: [phone]   Fax:  [phone]  Physical Therapy Treatment  Patient Details  Name: Gabriela Rush MRN: [id number] Date of Birth: 1965/12/19 Referring Provider: Arlester Marker, APRN (from [facility] in [place], [place]; PCP: Consuello Masse, MD)   Encounter Date: 11/05/2017  PT End of Session - 11/05/17 1352    Visit Number  5    Number of Visits  13    Date for PT Re-Evaluation  12/03/17    Authorization Type  BCBS Other    Authorization Time Period  10/22/17 to 12/03/17, 20 appointment limit.  Insurance only accepts following charges:  eval, re-eval, TE, NMR, gait.  NO other charges reimbursed    Authorization - Visit Number  5    Authorization - Number of Visits  20   will update as necessary; pt stating she thought that she was allowed up to 20 visits so will have front office double check as nothing was reported mentioning a visit limit   PT Start Time  1346    PT Stop Time  1431    PT Time Calculation (min)  45 min    Activity Tolerance  Patient tolerated treatment well;No increased pain    Behavior During Therapy  WFL for tasks assessed/performed       Past Medical History:  Diagnosis Date  . Diabetes mellitus without complication ([place])   . Family history of adverse reaction to anesthesia    Mother had problem with anesthesia; The "lost" her during a surgery; brought her back.  . Hypertension     Past Surgical History:  Procedure Laterality Date  . BREAST REDUCTION SURGERY    . CESAREAN SECTION  1988  . COLONOSCOPY N/A 10/20/2016   Procedure: COLONOSCOPY;  Surgeon: Danie Binder, MD;  [facility];  Service: Endoscopy;  Laterality: N/A;  1:00 pm    There were no vitals filed for this visit.  Subjective Assessment - 11/05/17 1349    Subjective  Patient arrives without SPC this session and reports that she is not using it at all anymore. She states her  exercises are going well and she is doing them daily. She denies pain at start of session. She reports she feels like it is difficult to move her Rt toes and foot.     Pertinent History  L4-5 TLIF on 09/09/17 at [facility] in [place], [place]    Limitations  Lifting;Standing;Walking;House hold activities    How long can you sit comfortably?  no issues     How long can you stand comfortably?  hasn't timed it, feels she could do 10-15 mins    How long can you walk comfortably?  unlimited with support    Patient Stated Goals  be able to bend again    Currently in Pain?  No/denies       [facility] Adult PT Treatment/Exercise - 11/05/17 0001      Lumbar Exercises: Standing   Other Standing Lumbar Exercises  wall arches with heel raises, 10 reps      Lumbar Exercises: Supine   Ab Set  15 reps;5 seconds    Pelvic Tilt  15 reps;5 seconds;Limitations    Pelvic Tilt Limitations  anterior/posterior pelvic tilt    Clam  15 reps;Limitations    Clam Limitations  sidelying no band    Bent Knee Raise  10 reps    Bent Knee Raise Limitations  wtih TrA  activaiton and 10 reps bil LE    Bridge with [employer]  15 reps;3 seconds    Bridge with [employer] Limitations  to deter external rotation at hips    Straight Leg Raise  15 reps;Limitations    Straight Leg Raises Limitations  bil LE    Other Supine Lumbar Exercises  quadruped: leg extension, bil LE, 10 reps      Ankle Exercises: Seated   Towel Crunch  Limitations    Towel Crunch Limitations  20 repetitons to activate short foot intrinsics    Towel Inversion/Eversion  1 rep    Towel Inversion/Eversion Limitations  full length of towel, eversion only    Marble Pickup  2x 20 marbles        PT Education - 11/05/17 1359    Education Details  Educated on maintaining neutral back to stay within "BLT" spine precautions and to bend at her hips for squating to lift. Edcuated on new fot exercises to address distal weaknesses.    Person(s) Educated  Patient     Methods  Explanation;Verbal cues    Comprehension  Verbalized understanding;Returned demonstration       PT Short Term Goals - 10/22/17 1631      PT SHORT TERM GOAL #1   Title  Pt will be independent with HEP and perform consistently in order to decrease pain and maximize return to PLOF.    Time  4    Period  Weeks    Status  New    Target Date  11/12/17      PT SHORT TERM GOAL #2   Title  Pt will have improved BLE MMT by 1/2 grade throughout in order to maximize gait and balance.    Time  3    Period  Weeks    Status  New      PT SHORT TERM GOAL #3   Title  Pt will be able to perform R SLS for 10 sec or > without Trendelenberg sign in order to demo improved functional glute med strength so as to maximize her gait and stair ambulation.    Time  3    Period  Weeks    Status  New      PT SHORT TERM GOAL #4   Title  Pt will be able to perform 12 STS during 30sec chair rise test in order to demo improved balance and functional strength.     Time  3    Status  New        PT Long Term Goals - 10/22/17 1632      PT LONG TERM GOAL #1   Title  Pt will have improved BLE MMT by 1 grade throughout in order to further maximize gait and balance and promote return to PLOF.    Time  6    Period  Weeks    Status  New    Target Date  12/03/17      PT LONG TERM GOAL #2   Title  Pt will be able to perform bil SLS for 20 sec or > without Trendelenberg sign in order to demo further improved core, functional, and BLE strength so as to maximize her gait on uneven ground and stair ambulation.    Time  6    Period  Weeks    Status  New      PT LONG TERM GOAL #3   Title  Pt will have 135ft improvement during 3MWT with gait  mechanics WFL and without signs of R Trendelenberg sign to demo improved functional strength and overall functional mobility.    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    Time  6    Period  Weeks    Status  New        Plan - 11/05/17 1352    Clinical Impression Statement  Continued with established POC for core strengthening with focus on abdominals and hips. Added wall arches for spine elongation to improve posture and patient reported difficulty with heel raises during exercise. Rt foot exercises were added today as patient complained of difficulty moving her foot and toes since prior to her surgery, observation by therapist reveled decreased AROM in ankle and digits. Patient reported excellent response to marble pick up and stated it "makes my foot feel better" but had difficulty with towel eversion and towel crunch exercise requiring min assist to perform properly. She will benefit from continued skilled PT interventions to improve functional mobility.    Rehab Potential  Good    PT Frequency  2x / week    PT Duration  6 weeks    PT Treatment/Interventions  ADLs/Self Care Home Management;Cryotherapy;Electrical Stimulation;Moist Heat;Ultrasound;DME Instruction;Gait training;Functional mobility training;Therapeutic activities;Stair training;Therapeutic exercise;Balance training;Neuromuscular re-education;Patient/family education;Manual techniques;Passive range of motion;Scar mobilization;Dry needling;Taping   per face sheet, aquatic therapy and manual therapy not covered by insurance   PT Next Visit Plan  Continue to progress core, postural, BLE, and functional strengthening as tolerated and within BLT lumbar precautions.  Continue with ankle/foot exercises for Rt foot and introduce standing heel/toe raise on incline/decline. Await final word per manual therapy as insurance does not cover it, add if able to complete.    PT Home Exercise Plan  eval: supine ab set, seated heel and toe raises    Consulted  and Agree with Plan of Care  Patient       Patient will benefit from skilled therapeutic intervention in order to improve the following deficits and impairments:  Abnormal gait, Decreased activity tolerance, Decreased balance, Decreased endurance, Decreased mobility, Decreased range of motion, Decreased strength, Difficulty walking, Hypomobility, Increased fascial restricitons, Increased muscle spasms, Impaired flexibility, Impaired sensation, Improper body mechanics, Postural dysfunction, Pain  Visit Diagnosis: Acute bilateral low back pain without sciatica  Muscle weakness (generalized)  Difficulty in walking, not elsewhere classified     Problem List Patient Active Problem List   Diagnosis Date Noted  . Special screening for malignant neoplasms, colon     Kipp Brood, PT, DPT Physical Therapist with [facility]  11/05/2017 2:35 PM    [place], [place], [address] Phone: [phone]   Fax:  [phone]  Name: Estelita Iten MRN: [id number] Date of Birth: [birth year]

## 2017-11-10 ENCOUNTER — Other Ambulatory Visit: Payer: Self-pay

## 2017-11-10 ENCOUNTER — Ambulatory Visit (HOSPITAL_COMMUNITY): Payer: BLUE CROSS/BLUE SHIELD

## 2017-11-10 ENCOUNTER — Encounter (HOSPITAL_COMMUNITY): Payer: Self-pay

## 2017-11-10 DIAGNOSIS — M545 Low back pain, unspecified: Secondary | ICD-10-CM

## 2017-11-10 DIAGNOSIS — R262 Difficulty in walking, not elsewhere classified: Secondary | ICD-10-CM

## 2017-11-10 DIAGNOSIS — M6281 Muscle weakness (generalized): Secondary | ICD-10-CM

## 2017-11-10 NOTE — Therapy (Signed)
[place], [place], [address] Phone: [phone]   Fax:  [phone]  Physical Therapy Treatment  Patient Details  Name: Gabriela Rush MRN: [id number] Date of Birth: [birth year] Referring Provider: Arlester Marker, APRN (from [facility] in [place], [place]; PCP: Consuello Masse, MD)   Encounter Date: 11/10/2017  PT End of Session - 11/10/17 1401    Visit Number  6    Number of Visits  13    Date for PT Re-Evaluation  12/03/17    Authorization Type  BCBS Other    Authorization Time Period  10/22/17 to 12/03/17, 20 appointment limit.  Insurance only accepts following charges:  eval, re-eval, TE, NMR, gait.  NO other charges reimbursed    Authorization - Visit Number  6    Authorization - Number of Visits  20   will update as necessary; pt stating she thought that she was allowed up to 20 visits so will have front office double check as nothing was reported mentioning a visit limit   PT Start Time  1351    PT Stop Time  1430    PT Time Calculation (min)  39 min    Activity Tolerance  Patient tolerated treatment well;No increased pain    Behavior During Therapy  WFL for tasks assessed/performed       Past Medical History:  Diagnosis Date  . Diabetes mellitus without complication ([place])   . Family history of adverse reaction to anesthesia    Mother had problem with anesthesia; The "lost" her during a surgery; brought her back.  . Hypertension     Past Surgical History:  Procedure Laterality Date  . BREAST REDUCTION SURGERY    . CESAREAN SECTION  1988  . COLONOSCOPY N/A 10/20/2016   Procedure: COLONOSCOPY;  Surgeon: Danie Binder, MD;  [facility];  Service: Endoscopy;  Laterality: N/A;  1:00 pm    There were no vitals filed for this visit.  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 She reports they are trying to find another position for her. She states her foot is feeling a little bit better but her back is around a 2/10 today.    Pertinent History  L4-5 TLIF on 09/09/17 at [facility] in [place], [place]    Limitations  Lifting;Standing;Walking;House hold activities    How long can you sit comfortably?  no issues     How long can you stand comfortably?  hasn't timed it, feels she could do 10-15 mins    How long can you walk comfortably?  unlimited with support    Patient Stated Goals  be able to bend again    Currently in Pain?  Yes    Pain Score  2     Pain Location  Back    Pain Orientation  Right    Pain Descriptors / Indicators  Aching    Pain Type  Chronic pain;Surgical pain    Pain Onset  More than a month ago    Pain Frequency  Intermittent    Aggravating Factors   just if overdo it    Pain Relieving Factors  rest        [facility] Adult PT Treatment/Exercise - 11/10/17 0001      Exercises   Exercises  Lumbar  Lumbar Exercises: Standing   Heel Raises  15 reps;3 seconds;Limitations   on incline   Heel Raises Limitations  toe raises, 15 reps, 3 sec holds, on decline    Functional Squats  10 reps    Functional Squats Limitations  2 sets, chair taps with pillow on it    Other Standing Lumbar Exercises  wall arches with heel raises, 10 reps      Lumbar Exercises: Supine   Bridge with Ball Squeeze  10 reps;3 seconds    Bridge with [employer] Limitations  2 sets, to deter external rotation at hips    Straight Leg Raise  15 reps;Limitations    Straight Leg Raises Limitations  bil LE      Lumbar Exercises: Sidelying   Clam  Both;3 seconds;Limitations;10 reps    Clam Limitations  red theraband      Ankle Exercises: Seated   Towel Inversion/Eversion  2 reps;Limitations    Towel Inversion/Eversion Limitations  full length of towel 2x for inversion/eversion        PT Education - 11/10/17 1412    Education Details  Educated  on exercise thorughout session. Educated on squat for with sit to stand/chair taps, patient requires verbal/tactile cues.     Person(s) Educated  Patient    Methods  Explanation;Tactile cues;Verbal cues    Comprehension  Verbalized understanding       PT Short Term Goals - 10/22/17 1631      PT SHORT TERM GOAL #1   Title  Pt will be independent with HEP and perform consistently in order to decrease pain and maximize return to PLOF.    Time  4    Period  Weeks    Status  New    Target Date  11/12/17      PT SHORT TERM GOAL #2   Title  Pt will have improved BLE MMT by 1/2 grade throughout in order to maximize gait and balance.    Time  3    Period  Weeks    Status  New      PT SHORT TERM GOAL #3   Title  Pt will be able to perform R SLS for 10 sec or > without Trendelenberg sign in order to demo improved functional glute med strength so as to maximize her gait and stair ambulation.    Time  3    Period  Weeks    Status  New      PT SHORT TERM GOAL #4   Title  Pt will be able to perform 12 STS during 30sec chair rise test in order to demo improved balance and functional strength.     Time  3    Status  New        PT Long Term Goals - 10/22/17 1632      PT LONG TERM GOAL #1   Title  Pt will have improved BLE MMT by 1 grade throughout in order to further maximize gait and balance and promote return to PLOF.    Time  6    Period  Weeks    Status  New    Target Date  12/03/17      PT LONG TERM GOAL #2   Title  Pt will be able to perform bil SLS for 20 sec or > without Trendelenberg sign in order to demo further improved core, functional, and BLE strength so as to maximize her gait on uneven ground and stair ambulation.  Time  6    Period  Weeks    Status  New      PT LONG TERM GOAL #3   Title  Pt will have 151ft improvement during 3MWT with gait mechanics WFL and without signs of R Trendelenberg sign to demo improved functional strength and overall functional mobility.     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    Time  6    Period  Weeks    Status  New        Plan - 11/10/17 1413    Clinical Impression Statement  Continued with focus on LE strengthening and continued functional squats this session. Patient requires verbal cues and tactile cues at hips to facilitate hip flexion for increased squat depth. She demonstrated improved form on second set requiring fewer cues. Patient continued with Rt foot exercises and had improved ankle inversion/eversion this session. She was provided handout on towel ankle ROM exercise and marble/bead pick up to begin at home for Rt foot. She will benefit from continued skilled PT interventions to improve functional mobility.    Rehab Potential  Good    PT Frequency  2x / week    PT Duration  6 weeks    PT Treatment/Interventions  ADLs/Self Care Home Management;Cryotherapy;Electrical Stimulation;Moist Heat;Ultrasound;DME Instruction;Gait training;Functional mobility training;Therapeutic activities;Stair training;Therapeutic exercise;Balance training;Neuromuscular re-education;Patient/family education;Manual techniques;Passive range of motion;Scar mobilization;Dry needling;Taping   per face sheet, aquatic therapy and manual therapy not covered by insurance   PT Next Visit Plan  Continue to progress core, postural, BLE, and functional strengthening as tolerated and within BLT lumbar precautions.  Continue with ankle/foot exercises for Rt foot and continue standing heel/toe raise on incline/decline.     PT Home Exercise Plan  eval: supine ab set, seated heel and toe raises    Consulted and Agree with Plan of Care  Patient       Patient will benefit from skilled therapeutic  intervention in order to improve the following deficits and impairments:  Abnormal gait, Decreased activity tolerance, Decreased balance, Decreased endurance, Decreased mobility, Decreased range of motion, Decreased strength, Difficulty walking, Hypomobility, Increased fascial restricitons, Increased muscle spasms, Impaired flexibility, Impaired sensation, Improper body mechanics, Postural dysfunction, Pain  Visit Diagnosis: Acute bilateral low back pain without sciatica  Muscle weakness (generalized)  Difficulty in walking, not elsewhere classified     Problem List Patient Active Problem List   Diagnosis Date Noted  . Special screening for malignant neoplasms, colon     Kipp Brood, PT, DPT Physical Therapist with [facility]  11/10/2017 2:43 PM    [place] [address] [place], [place], [address] Phone: [phone]   Fax:  [phone]  Name: Ledonna Dormer MRN: [id number] Date of Birth: [birth year]

## 2017-11-12 ENCOUNTER — Ambulatory Visit (HOSPITAL_COMMUNITY): Payer: BLUE CROSS/BLUE SHIELD | Admitting: Physical Therapy

## 2017-11-12 ENCOUNTER — Encounter (HOSPITAL_COMMUNITY): Payer: Self-pay | Admitting: Physical Therapy

## 2017-11-12 DIAGNOSIS — M545 Low back pain, unspecified: Secondary | ICD-10-CM

## 2017-11-12 DIAGNOSIS — R262 Difficulty in walking, not elsewhere classified: Secondary | ICD-10-CM

## 2017-11-12 DIAGNOSIS — M6281 Muscle weakness (generalized): Secondary | ICD-10-CM

## 2017-11-12 NOTE — Therapy (Signed)
[place], [place], [address] Phone: [phone]   Fax:  [phone]  Physical Therapy Treatment  Patient Details  Name: Gabriela Rush MRN: [id number] Date of Birth: 1965-12-02 Referring Provider: Arlester Marker, APRN (from [facility] in [place], [place]; PCP: Consuello Masse, MD)   Encounter Date: 11/12/2017  PT End of Session - 11/12/17 1303    Visit Number  7    Number of Visits  13    Date for PT Re-Evaluation  12/03/17    Authorization Type  BCBS Other    Authorization Time Period  10/22/17 to 12/03/17, 20 appointment limit.  Insurance only accepts following charges:  eval, re-eval, TE, NMR, gait.  NO other charges reimbursed    Authorization - Visit Number  7    Authorization - Number of Visits  20   will update as necessary; pt stating she thought that she was allowed up to 20 visits so will have front office double check as nothing was reported mentioning a visit limit   PT Start Time  1300    PT Stop Time  1339    PT Time Calculation (min)  39 min    Activity Tolerance  Patient tolerated treatment well;No increased pain    Behavior During Therapy  WFL for tasks assessed/performed       Past Medical History:  Diagnosis Date  . Diabetes mellitus without complication ([place])   . Family history of adverse reaction to anesthesia    Mother had problem with anesthesia; The "lost" her during a surgery; brought her back.  . Hypertension     Past Surgical History:  Procedure Laterality Date  . BREAST REDUCTION SURGERY    . CESAREAN SECTION  1988  . COLONOSCOPY N/A 10/20/2016   Procedure: COLONOSCOPY;  Surgeon: Danie Binder, MD;  [facility];  Service: Endoscopy;  Laterality: N/A;  1:00 pm    There were no vitals filed for this visit.  Subjective Assessment - 11/12/17 1301    Subjective  Patient reported that she hasn't had any medical changes. She stated that she has been doing her exercises at  home.     Pertinent History  L4-5 TLIF on 09/09/17 at [facility] in [place], [place]    Limitations  Lifting;Standing;Walking;House hold activities    How long can you sit comfortably?  no issues     How long can you stand comfortably?  hasn't timed it, feels she could do 10-15 mins    How long can you walk comfortably?  unlimited with support    Patient Stated Goals  be able to bend again    Currently in Pain?  No/denies                       [facility] Adult PT Treatment/Exercise - 11/12/17 0001      Lumbar Exercises: Standing   Heel Raises  15 reps;3 seconds;Limitations   on incline   Heel Raises Limitations  toe raises, 15 reps, 3 sec holds, on decline    Functional Squats  10 reps    Functional Squats Limitations  2 sets, chair taps with pillow on it    Other Standing Lumbar Exercises  Tandem ambulation 15 feet x 2. Stepping over 3 6-inch hurdles inside parallel bars alternating feet x 6 reptitions. Vector stance each lower extremity forward, side, and back 5x5'' holds. Tandem stance inside parallel bars 2x 30 seconds with intermittent upper extremity  assistance    Other Standing Lumbar Exercises  wall arches with heel raises, 10 reps      Lumbar Exercises: Supine   Bridge with Ball Squeeze  10 reps;3 seconds    Bridge with [employer] Limitations  2 sets, to deter external rotation at hips    Straight Leg Raise  15 reps;Limitations    Straight Leg Raises Limitations  bil LE      Lumbar Exercises: Sidelying   Clam  Both;3 seconds;Limitations;10 reps    Clam Limitations  red theraband             PT Education - 11/12/17 1302    Education Details  Patient was educated on purpose and technique of exercises throughout the session.     Person(s) Educated  Patient    Methods  Explanation    Comprehension  Verbalized understanding       PT Short Term Goals - 10/22/17 1631      PT SHORT TERM GOAL #1   Title  Pt will be independent with HEP and perform  consistently in order to decrease pain and maximize return to PLOF.    Time  4    Period  Weeks    Status  New    Target Date  11/12/17      PT SHORT TERM GOAL #2   Title  Pt will have improved BLE MMT by 1/2 grade throughout in order to maximize gait and balance.    Time  3    Period  Weeks    Status  New      PT SHORT TERM GOAL #3   Title  Pt will be able to perform R SLS for 10 sec or > without Trendelenberg sign in order to demo improved functional glute med strength so as to maximize her gait and stair ambulation.    Time  3    Period  Weeks    Status  New      PT SHORT TERM GOAL #4   Title  Pt will be able to perform 12 STS during 30sec chair rise test in order to demo improved balance and functional strength.     Time  3    Status  New        PT Long Term Goals - 10/22/17 1632      PT LONG TERM GOAL #1   Title  Pt will have improved BLE MMT by 1 grade throughout in order to further maximize gait and balance and promote return to PLOF.    Time  6    Period  Weeks    Status  New    Target Date  12/03/17      PT LONG TERM GOAL #2   Title  Pt will be able to perform bil SLS for 20 sec or > without Trendelenberg sign in order to demo further improved core, functional, and BLE strength so as to maximize her gait on uneven ground and stair ambulation.    Time  6    Period  Weeks    Status  New      PT LONG TERM GOAL #3   Title  Pt will have 179ft improvement during 3MWT with gait mechanics WFL and without signs of R Trendelenberg sign to demo improved functional strength and overall functional mobility.    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    Time  6    Period  Weeks    Status  New            Plan -  11/12/17 1341    Clinical Impression Statement  This session continued to progress patient with functional lower extremity strengthening, balance, and stability exercises. This session progressed patient with balance and ankle stability by performing tandem ambulation as well as stepping over hurdles inside parallel bars. Patient reported that the hurdles were challenging and would like to continue practicing these. Plan to continue with progression of lower extremity strengthening, balance, and overall functional mobility.     Rehab Potential  Good    PT Frequency  2x / week    PT Duration  6 weeks    PT Treatment/Interventions  ADLs/Self Care Home Management;Cryotherapy;Electrical Stimulation;Moist Heat;Ultrasound;DME Instruction;Gait training;Functional mobility training;Therapeutic activities;Stair training;Therapeutic exercise;Balance training;Neuromuscular re-education;Patient/family education;Manual techniques;Passive range of motion;Scar mobilization;Dry needling;Taping   per face sheet, aquatic therapy and manual therapy not covered by insurance   PT Next Visit Plan  Continue to progress core, postural, BLE, and functional strengthening as tolerated and within BLT lumbar precautions.  Continue with ankle/foot exercises for Rt foot and continue standing heel/toe raise on incline/decline.     PT Home Exercise Plan  eval: supine ab set, seated heel and toe raises    Consulted and Agree with Plan of Care  Patient       Patient will benefit from skilled therapeutic intervention in order to improve the following deficits and impairments:  Abnormal gait, Decreased activity tolerance, Decreased balance, Decreased endurance, Decreased mobility, Decreased range of motion, Decreased strength, Difficulty walking, Hypomobility, Increased fascial restricitons, Increased muscle spasms, Impaired flexibility, Impaired sensation, Improper body mechanics, Postural dysfunction, Pain  Visit Diagnosis: Acute  bilateral low back pain without sciatica  Muscle weakness (generalized)  Difficulty in walking, not elsewhere classified     Problem List Patient Active Problem List   Diagnosis Date Noted  . Special screening for malignant neoplasms, colon    Clarene Critchley PT, DPT 1:42 PM, 11/12/17 [place] [address] [place], [place], [address] Phone: [phone]   Fax:  [phone]  Name: Gabriela Rush MRN: [id number] Date of Birth: [birth year]

## 2017-11-17 ENCOUNTER — Ambulatory Visit (HOSPITAL_COMMUNITY): Payer: BLUE CROSS/BLUE SHIELD

## 2017-11-17 ENCOUNTER — Encounter (HOSPITAL_COMMUNITY): Payer: Self-pay

## 2017-11-17 DIAGNOSIS — M545 Low back pain, unspecified: Secondary | ICD-10-CM

## 2017-11-17 DIAGNOSIS — R262 Difficulty in walking, not elsewhere classified: Secondary | ICD-10-CM

## 2017-11-17 DIAGNOSIS — M6281 Muscle weakness (generalized): Secondary | ICD-10-CM

## 2017-11-17 NOTE — Patient Instructions (Signed)
Access Code: [email or web address]  URL: [email or web address]  Date: 11/17/2017  Prepared by: Geraldine Solar   Exercises Standing Hip Hinge with Dowel - 10 reps - 3 sets - 1x daily - 7x weekly

## 2017-11-17 NOTE — Therapy (Signed)
[place], [place], [address] Phone: [phone]   Fax:  [phone]   Progress Note Reporting Period 10/22/17 to 11/17/17  See note below for Objective Data and Assessment of Progress/Goals.   Physical Therapy Treatment  Patient Details  Name: Gabriela Rush MRN: [id number] Date of Birth: Nov 21, 1965 Referring Provider: Arlester Marker, APRN (from [facility] in [place], [place]; PCP Consuello Masse, MD)   Encounter Date: 11/17/2017  PT End of Session - 11/17/17 1259    Visit Number  8    Number of Visits  13    Date for PT Re-Evaluation  12/03/17   mini reassess completed 11/17/17   Authorization Type  BCBS Other    Authorization Time Period  10/22/17 to 12/03/17, 20 appointment limit.  Insurance only accepts following charges:  eval, re-eval, TE, NMR, gait.  NO other charges reimbursed    Authorization - Visit Number  8    Authorization - Number of Visits  20   will update as necessary; pt stating she thought that she was allowed up to 20 visits so will have front office double check as nothing was reported mentioning a visit limit   PT Start Time  1300    PT Stop Time  1341    PT Time Calculation (min)  41 min    Activity Tolerance  Patient tolerated treatment well;No increased pain    Behavior During Therapy  WFL for tasks assessed/performed       Past Medical History:  Diagnosis Date  . Diabetes mellitus without complication ([place])   . Family history of adverse reaction to anesthesia    Mother had problem with anesthesia; The "lost" her during a surgery; brought her back.  . Hypertension     Past Surgical History:  Procedure Laterality Date  . BREAST REDUCTION SURGERY    . CESAREAN SECTION  1988  . COLONOSCOPY N/A 10/20/2016   Procedure: COLONOSCOPY;  Surgeon: Danie Binder, MD;  [facility];  Service: Endoscopy;  Laterality: N/A;  1:00 pm    There were no vitals filed for this  visit.  Subjective Assessment - 11/17/17 1300    Subjective  Pt states that she is feeling pretty good todya. She reports her back felt like it had a pinch in it over the weekend but states it let up.     Pertinent History  L4-5 TLIF on 09/09/17 at [facility] in [place], [place]    Limitations  Lifting;Standing;Walking;House hold activities    How long can you sit comfortably?  no issues     How long can you stand comfortably?  hasn't timed it, feels she could do 10-15 mins    How long can you walk comfortably?  unlimited with support    Patient Stated Goals  be able to bend again    Currently in Pain?  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    Leisure  take care of grandbaby      Observation/Other Assessments   Focus on Therapeutic Outcomes (FOTO)   --   was 58% limited     Functional Tests   Functional tests  Sit to Stand      Sit to Stand   Comments  30sec chair rise test: 15x   was 8x     Strength   Right Hip Flexion  5/5   was 4+   Right Hip Extension  4-/5   was 3+   Right Hip ABduction  3+/5   was 3   Left Hip Flexion  5/5   was 4+   Left Hip Extension  4/5   was 4-   Left Hip ABduction  4/5   was 3+   Right Knee Flexion  5/5   was 4+   Right Knee Extension  5/5   was 4+   Right Ankle Dorsiflexion  3+/5   was 3+     Ambulation/Gait   Ambulation Distance (Feet)  730 Feet   3MWT; was 362f with no AD   Assistive device  None    Gait Pattern   Step-through pattern;Decreased stance time - right;Decreased dorsiflexion - right;Trendelenburg   decreased pelvic control throughout and minor R foot drop   Gait Comments  minor foot drop on R due to anterior tib weakness      Balance   Balance Assessed  Yes      Static Standing Balance   Static Standing - Balance Support  No upper extremity supported    Static Standing Balance -  Activities   Single Leg Stance - Right Leg;Single Leg Stance - Left Leg    Static Standing - Comment/# of Minutes  R: 2sec or < L: 25 sec   was R: 2 sec or <, L: 13 sec           [facility] Adult PT Treatment/Exercise - 11/17/17 0001      Exercises   Exercises  Lumbar      Lumbar Exercises: Standing   Shoulder Adduction Limitations  practicing proper lifting technique; hip hinging with dowel x12 reps           PT Education - 11/17/17 1300    Education Details  reassessment findings    Person(s) Educated  Patient    Methods  Demonstration;Explanation    Comprehension  Verbalized understanding;Returned demonstration       PT Short Term Goals - 11/17/17 1303      PT SHORT TERM GOAL #1   Title  Pt will be independent with HEP and perform consistently in order to decrease pain and maximize return to PLOF.    Time  4    Period  Weeks    Status  Achieved      PT SHORT TERM GOAL #2   Title  Pt will have improved BLE MMT by 1/2 grade throughout in order to maximize gait and balance.    Baseline  8/20: see MMT    Time  3    Period  Weeks    Status  Partially Met      PT SHORT TERM GOAL #3   Title  Pt will be able to perform R SLS for 10 sec or > without Trendelenberg sign in order to demo improved functional glute med  strength so as to maximize her gait and stair ambulation.    Baseline  8/20: 2 sec or < still with Trendelenberg    Time  3    Period  Weeks    Status  On-going      PT SHORT TERM GOAL #4   Title  Pt will be able to perform 12 STS during 30sec chair rise test in order to demo  improved balance and functional strength.     Baseline  8/20: 15x    Time  3    Status  Achieved        PT Long Term Goals - 11/17/17 1303      PT LONG TERM GOAL #1   Title  Pt will have improved BLE MMT by 1 grade throughout in order to further maximize gait and balance and promote return to PLOF.    Baseline  8/20: see MMT    Time  6    Period  Weeks    Status  On-going      PT LONG TERM GOAL #2   Title  Pt will be able to perform bil SLS for 20 sec or > without Trendelenberg sign in order to demo further improved core, functional, and BLE strength so as to maximize her gait on uneven ground and stair ambulation.    Baseline  8/20: L: 25sec, RL 2 sec or <    Time  6    Period  Weeks    Status  Partially Met      PT LONG TERM GOAL #3   Title  Pt will have 156f improvement during 3MWT with gait mechanics WFL and without signs of R Trendelenberg sign to demo improved functional strength and overall functional mobility.    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    Baseline  8/20: hasn't really tried, but states she can walk for 1 hour; feels she could probably stand for 25-20m73mnow though    Time  6    Period  Weeks    Status  On-going            Plan - 11/17/17 1348    Clinical Impression Statement  PT reassessed pt's goals and outcome measures this date. Pt has made great progress towards goals as illustrated above. Her MMT has improved throughout, except for R ankle DF, her balance has improved on the L, and her 3MWT significantly improved in distance walked. Her functional BLE strength has improved AEB 15STS during  30 sec chair rise test (was 8). She still demo's Tremdelenberg sign during SLS and gait on the RLE and deficits in core strength during ambulation AEB decreased pelvic control throughout. Overall, pt has made great progress thus far and needs continued skilled PT intervention to further maximize strength, balance, core strength, functional strength and overall mobility in order to promote return to PLOF. Pt inquiring about bending forward and lifting and when that would be addressed during sessions as she will be required to do bending and light  lifting at work and home. PT educated pt that since she is still on BLT precautions, excessive bending will be avoided for now and that she will want to learn how to properly squat to lift in order to decrease stress and strain on her lower back. Rest of session focused on proper hip hinging with a dowel as her squat demo'd increased anterior knee translation over toes with hardly any hip flexion. Pt responded very well to hip hinging with dowel AEB good hip flexion and no lumbar flexion noted during. Added hip hinging to her HEP to further practice this so that she could squat and lift properly. Continue as planned, progressing as able.     Rehab Potential  Good    PT Frequency  2x / week    PT Duration  6 weeks    PT Treatment/Interventions  ADLs/Self Care Home Management;Cryotherapy;Electrical Stimulation;Moist Heat;Ultrasound;DME Instruction;Gait training;Functional mobility training;Therapeutic activities;Stair training;Therapeutic exercise;Balance training;Neuromuscular re-education;Patient/family education;Manual techniques;Passive range of motion;Scar mobilization;Dry needling;Taping   per face sheet, aquatic therapy and manual therapy not covered by insurance   PT Next Visit Plan  continue hip hinging/proper lifting technique, increased R ankle DF strength; continue to progress core, postural, BLE, and functional strengthening as tolerated and within BLT  lumbar precautions, continue with ankle/foot exercises for Rt foot and continue standing heel/toe raise on incline/decline.     PT Home Exercise Plan  eval: supine ab set, seated heel and toe raises; 8/20: hip hinge with dowel    Consulted and Agree with Plan of Care  Patient       Patient will benefit from skilled therapeutic intervention in order to improve the following deficits and impairments:  Abnormal gait, Decreased activity tolerance, Decreased balance, Decreased endurance, Decreased mobility, Decreased range of motion, Decreased strength, Difficulty walking, Hypomobility, Increased fascial restricitons, Increased muscle spasms, Impaired flexibility, Impaired sensation, Improper body mechanics, Postural dysfunction, Pain  Visit Diagnosis: Acute bilateral low back pain without sciatica  Muscle weakness (generalized)  Difficulty in walking, not elsewhere classified     Problem List Patient Active Problem List   Diagnosis Date Noted  . Special screening for malignant neoplasms, colon       Geraldine Solar PT, DPT  [place] [address] [place], [place], [address] Phone: [phone]   Fax:  [phone]  Name: Gabriela Rush MRN: [id number] Date of Birth: 1965-10-22

## 2017-11-19 ENCOUNTER — Ambulatory Visit (HOSPITAL_COMMUNITY): Payer: BLUE CROSS/BLUE SHIELD | Admitting: Physical Therapy

## 2017-11-19 DIAGNOSIS — M545 Low back pain, unspecified: Secondary | ICD-10-CM

## 2017-11-19 DIAGNOSIS — M6281 Muscle weakness (generalized): Secondary | ICD-10-CM

## 2017-11-19 DIAGNOSIS — R262 Difficulty in walking, not elsewhere classified: Secondary | ICD-10-CM

## 2017-11-19 NOTE — Therapy (Signed)
[place], [place], [address] Phone: [phone]   Fax:  [phone]  Physical Therapy Treatment  Patient Details  Name: Gabriela Rush MRN: [id number] Date of Birth: January 11, 1966 Referring Provider: Arlester Marker, APRN (from [facility] in [place], [place]; PCP Consuello Masse, MD)   Encounter Date: 11/19/2017  PT End of Session - 11/19/17 1335    Visit Number  9    Number of Visits  13    Date for PT Re-Evaluation  12/03/17   mini reassess completed 11/17/17   Authorization Type  BCBS Other    Authorization Time Period  10/22/17 to 12/03/17, 20 appointment limit.  Insurance only accepts following charges:  eval, re-eval, TE, NMR, gait.  NO other charges reimbursed    Authorization - Visit Number  9    Authorization - Number of Visits  20   will update as necessary; pt stating she thought that she was allowed up to 20 visits so will have front office double check as nothing was reported mentioning a visit limit   PT Start Time  1307    PT Stop Time  1348    PT Time Calculation (min)  41 min    Activity Tolerance  Patient tolerated treatment well;No increased pain    Behavior During Therapy  WFL for tasks assessed/performed       Past Medical History:  Diagnosis Date  . Diabetes mellitus without complication ([place])   . Family history of adverse reaction to anesthesia    Mother had problem with anesthesia; The "lost" her during a surgery; brought her back.  . Hypertension     Past Surgical History:  Procedure Laterality Date  . BREAST REDUCTION SURGERY    . CESAREAN SECTION  1988  . COLONOSCOPY N/A 10/20/2016   Procedure: COLONOSCOPY;  Surgeon: Danie Binder, MD;  [facility];  Service: Endoscopy;  Laterality: N/A;  1:00 pm    There were no vitals filed for this visit.  Subjective Assessment - 11/19/17 1348    Subjective  Pt states she is doing OK with  little pain today.  Points to pain at Lt hip  right where they did the surgery.  Less tingling in the toes                       [facility] Adult PT Treatment/Exercise - 11/19/17 1323      Lumbar Exercises: Standing   Heel Raises  15 reps;3 seconds;Limitations    Heel Raises Limitations  toe raises, 15 reps, 3 sec holds, on decline    Functional Squats  10 reps    Functional Squats Limitations  2 sets, chair taps with pillow on it    Other Standing Lumbar Exercises  lifting strategies/techniques demonstration/no weights      Lumbar Exercises: Supine   Bridge with Ball Squeeze  15 reps;3 seconds    Bridge with [employer] Limitations  2 sets, to deter external rotation at hips    Straight Leg Raise  15 reps;Limitations      Lumbar Exercises: Sidelying   Clam  Both;3 seconds;Limitations;15 reps    Clam Limitations  red theraband          Balance Exercises - 11/19/17 1319      Balance Exercises: Standing   SLS with Vectors  Solid surface;Limitations   10 reps 5" holds 1 HHA   Tandem Gait  Forward;2 reps    Retro  Gait  2 reps    Sidestepping  2 reps;Theraband   red       PT Education - 11/19/17 1347    Education Details  educated on golf lift and concepts of proper lifting.  Demonstrated with different scenarios and strategies.     Person(s) Educated  Patient    Methods  Explanation;Demonstration    Comprehension  Verbalized understanding;Returned demonstration       PT Short Term Goals - 11/17/17 1303      PT SHORT TERM GOAL #1   Title  Pt will be independent with HEP and perform consistently in order to decrease pain and maximize return to PLOF.    Time  4    Period  Weeks    Status  Achieved      PT SHORT TERM GOAL #2   Title  Pt will have improved BLE MMT by 1/2 grade throughout in order to maximize gait and balance.    Baseline  8/20: see MMT    Time  3    Period  Weeks    Status  Partially Met      PT SHORT TERM GOAL #3   Title  Pt will be able to perform R SLS for 10 sec or > without  Trendelenberg sign in order to demo improved functional glute med strength so as to maximize her gait and stair ambulation.    Baseline  8/20: 2 sec or < still with Trendelenberg    Time  3    Period  Weeks    Status  On-going      PT SHORT TERM GOAL #4   Title  Pt will be able to perform 12 STS during 30sec chair rise test in order to demo improved balance and functional strength.     Baseline  8/20: 15x    Time  3    Status  Achieved        PT Long Term Goals - 11/17/17 1303      PT LONG TERM GOAL #1   Title  Pt will have improved BLE MMT by 1 grade throughout in order to further maximize gait and balance and promote return to PLOF.    Baseline  8/20: see MMT    Time  6    Period  Weeks    Status  On-going      PT LONG TERM GOAL #2   Title  Pt will be able to perform bil SLS for 20 sec or > without Trendelenberg sign in order to demo further improved core, functional, and BLE strength so as to maximize her gait on uneven ground and stair ambulation.    Baseline  8/20: L: 25sec, RL 2 sec or <    Time  6    Period  Weeks    Status  Partially Met      PT LONG TERM GOAL #3   Title  Pt will have 145f improvement during 3MWT with gait mechanics WFL and without signs of R Trendelenberg sign to demo improved functional strength and overall functional mobility.    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    Baseline  8/20: hasn't really tried, but states she can walk for 1 hour; feels she could probably stand for 25-63mns now though    Time  6    Period  Weeks    Status  On-going            Plan - 11/19/17 1348     Clinical Impression Statement  Continued with current plan of care including LE strengthening, balance and overall lifting education.    Demonstrated proper body mechanics for retrieving items off floor and getting into low drawers/cabinets.  Pt able to replicate correct posturing to complete these tasks.  Increased reps/sets of some therex.  Began side stepping iwth theraband, tandem and retro gait to address balance.      Rehab Potential  Good    PT Frequency  2x / week    PT Duration  6 weeks    PT Treatment/Interventions  ADLs/Self Care Home Management;Cryotherapy;Electrical Stimulation;Moist Heat;Ultrasound;DME Instruction;Gait training;Functional mobility training;Therapeutic activities;Stair training;Therapeutic exercise;Balance training;Neuromuscular re-education;Patient/family education;Manual techniques;Passive range of motion;Scar mobilization;Dry needling;Taping   per face sheet, aquatic therapy and manual therapy not covered by insurance   PT Next Visit Plan  continue hip hinging/proper lifting technique, increased R ankle DF strength; continue to progress core, postural, BLE, and functional strengthening as tolerated and within BLT lumbar precautions.  Next session begin tandem gait on foam.     PT Home Exercise Plan  eval: supine ab set, seated heel and toe raises; 8/20: hip hinge with dowel    Consulted and Agree with Plan of Care  Patient       Patient will benefit from skilled therapeutic intervention in order to improve the following deficits and impairments:  Abnormal gait, Decreased activity tolerance, Decreased balance, Decreased endurance, Decreased mobility, Decreased range of motion, Decreased strength, Difficulty walking, Hypomobility, Increased fascial restricitons, Increased muscle spasms, Impaired flexibility, Impaired sensation, Improper body mechanics, Postural dysfunction, Pain  Visit Diagnosis: Acute bilateral low back pain without sciatica  Muscle weakness  (generalized)  Difficulty in walking, not elsewhere classified     Problem List Patient Active Problem List   Diagnosis Date Noted  . Special screening for malignant neoplasms, colon    ATeena Irani PTA/CLT [phone] FTeena Irani8/22/2019, 2:05 PM  [place]SW[place] [address]Phone: [phone]  Fax:  [phone] Name: LSari CoganMRN: [id number] of Birth: 403/28/1967

## 2017-11-24 ENCOUNTER — Ambulatory Visit (HOSPITAL_COMMUNITY): Payer: BLUE CROSS/BLUE SHIELD | Admitting: Physical Therapy

## 2017-11-24 DIAGNOSIS — M6281 Muscle weakness (generalized): Secondary | ICD-10-CM

## 2017-11-24 DIAGNOSIS — M545 Low back pain, unspecified: Secondary | ICD-10-CM

## 2017-11-24 DIAGNOSIS — R262 Difficulty in walking, not elsewhere classified: Secondary | ICD-10-CM

## 2017-11-24 NOTE — Therapy (Signed)
[place], [place], [address] Phone: [phone]   Fax:  [phone]  Physical Therapy Treatment  Patient Details  Name: Gabriela Rush MRN: [id number] Date of Birth: [birth year] Referring Provider: Arlester Marker, APRN (from [facility] in [place], [place]; PCP Consuello Masse, MD)   Encounter Date: 11/24/2017  PT End of Session - 11/24/17 1342    Visit Number  10    Number of Visits  13    Date for PT Re-Evaluation  12/03/17   mini reassess completed 11/17/17 with 10th visit update   Authorization Type  BCBS Other    Authorization Time Period  10/22/17 to 12/03/17, 20 appointment limit.  Insurance only accepts following charges:  eval, re-eval, TE, NMR, gait.  NO other charges reimbursed    Authorization - Visit Number  10    Authorization - Number of Visits  20   will update as necessary; pt stating she thought that she was allowed up to 20 visits so will have front office double check as nothing was reported mentioning a visit limit   PT Start Time  1306    PT Stop Time  1345    PT Time Calculation (min)  39 min    Activity Tolerance  Patient tolerated treatment well;No increased pain    Behavior During Therapy  WFL for tasks assessed/performed       Past Medical History:  Diagnosis Date  . Diabetes mellitus without complication ([place])   . Family history of adverse reaction to anesthesia    Mother had problem with anesthesia; The "lost" her during a surgery; brought her back.  . Hypertension     Past Surgical History:  Procedure Laterality Date  . BREAST REDUCTION SURGERY    . CESAREAN SECTION  1988  . COLONOSCOPY N/A 10/20/2016   Procedure: COLONOSCOPY;  Surgeon: Danie Binder, MD;  [facility];  Service: Endoscopy;  Laterality: N/A;  1:00 pm    There were no vitals filed for this visit.  Subjective Assessment - 11/24/17 1313    Subjective  Pt states her pain is not bad today, just 1.5/10 right  in the place where they did her surgery over her Lt hip.  tingling only into her Rt great toe.      Currently in Pain?  Yes    Pain Score  2     Pain Location  Back    Pain Orientation  Left                       [facility] Adult PT Treatment/Exercise - 11/24/17 0001      Lumbar Exercises: Standing   Heel Raises  Limitations;20 reps    Heel Raises Limitations  toe raises, 20 reps, 3 sec holds, on decline    Functional Squats  15 reps    Functional Squats Limitations  2 sets, chair taps with pillow on it    Other Standing Lumbar Exercises  lifting strategies/techniques demonstration/no weights.  from floor to top and middle shelves      Lumbar Exercises: Supine   Bridge with Diona Foley Squeeze  15 reps;3 seconds    Bridge with [employer] Limitations  2 sets, to deter external rotation at hips    Straight Leg Raise  15 reps;Limitations    Straight Leg Raises Limitations  bil LE 2 sets      Lumbar Exercises: Sidelying   Clam  Both;3 seconds;Limitations;15 reps  Clam Limitations  GTB 2 sets each      Lumbar Exercises: Prone   Straight Leg Raise  10 reps;Limitations    Straight Leg Raises Limitations  2 sets               PT Short Term Goals - 11/17/17 1303      PT SHORT TERM GOAL #1   Title  Pt will be independent with HEP and perform consistently in order to decrease pain and maximize return to PLOF.    Time  4    Period  Weeks    Status  Achieved      PT SHORT TERM GOAL #2   Title  Pt will have improved BLE MMT by 1/2 grade throughout in order to maximize gait and balance.    Baseline  8/20: see MMT    Time  3    Period  Weeks    Status  Partially Met      PT SHORT TERM GOAL #3   Title  Pt will be able to perform R SLS for 10 sec or > without Trendelenberg sign in order to demo improved functional glute med strength so as to maximize her gait and stair ambulation.    Baseline  8/20: 2 sec or < still with Trendelenberg    Time  3    Period  Weeks     Status  On-going      PT SHORT TERM GOAL #4   Title  Pt will be able to perform 12 STS during 30sec chair rise test in order to demo improved balance and functional strength.     Baseline  8/20: 15x    Time  3    Status  Achieved        PT Long Term Goals - 11/17/17 1303      PT LONG TERM GOAL #1   Title  Pt will have improved BLE MMT by 1 grade throughout in order to further maximize gait and balance and promote return to PLOF.    Baseline  8/20: see MMT    Time  6    Period  Weeks    Status  On-going      PT LONG TERM GOAL #2   Title  Pt will be able to perform bil SLS for 20 sec or > without Trendelenberg sign in order to demo further improved core, functional, and BLE strength so as to maximize her gait on uneven ground and stair ambulation.    Baseline  8/20: L: 25sec, RL 2 sec or <    Time  6    Period  Weeks    Status  Partially Met      PT LONG TERM GOAL #3   Title  Pt will have 174f improvement during 3MWT with gait mechanics WFL and without signs of R Trendelenberg sign to demo improved functional strength and overall functional mobility.    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    Baseline  8/20: hasn't really  tried, but states she can walk for 1 hour; feels she could probably stand for 25-63mns now though    Time  6    Period  Weeks    Status  On-going            Plan - 11/24/17 1519    Clinical Impression Statement  Continued with strengthening for core and LE's.  Added additional set to most exercises today and completed wtih minimal rest breaks.   had patient complete lifting from floor and waist  height with miniimal cues needed for form.  Tends to bend at waist rather than knees to get lower, needing cues for this.  Pt reported good work out at end of session today.  continues to require cues for form and general technque.    Rehab Potential  Good    PT Frequency  2x / week    PT Duration  6 weeks    PT Treatment/Interventions  ADLs/Self Care Home Management;Cryotherapy;Electrical Stimulation;Moist Heat;Ultrasound;DME Instruction;Gait training;Functional mobility training;Therapeutic activities;Stair training;Therapeutic exercise;Balance training;Neuromuscular re-education;Patient/family education;Manual techniques;Passive range of motion;Scar mobilization;Dry needling;Taping   per face sheet, aquatic therapy and manual therapy not covered by insurance   PT Next Visit Plan  continue hip hinging/proper lifting technique, increased R ankle DF strength; continue to progress core, postural, BLE, and functional strengthening as tolerated and within BLT lumbar precautions.  Next session begin tandem gait on foam.     PT Home Exercise Plan  eval: supine ab set, seated heel and toe raises; 8/20: hip hinge with dowel    Consulted and Agree with Plan of Care  Patient       Patient will benefit from skilled therapeutic intervention in order to improve the following deficits and impairments:  Abnormal gait, Decreased activity tolerance, Decreased balance, Decreased endurance, Decreased mobility, Decreased range of motion, Decreased strength, Difficulty walking, Hypomobility, Increased fascial restricitons, Increased muscle spasms, Impaired flexibility, Impaired sensation, Improper body mechanics, Postural dysfunction, Pain  Visit Diagnosis: Acute bilateral low back pain without sciatica  Muscle weakness (generalized)  Difficulty in walking, not elsewhere classified     Problem List Patient Active Problem List   Diagnosis Date Noted  . Special screening for malignant neoplasms, colon    ATeena Irani PTA/CLT [phone] FTeena Irani8/27/2019, 3:24 PM  [place] [address]Phone: [phone]  Fax:  [phone] Name: Gabriela CullinanMRN: [id number] of Birth: [birth year]

## 2017-11-26 ENCOUNTER — Ambulatory Visit (HOSPITAL_COMMUNITY): Payer: BLUE CROSS/BLUE SHIELD | Admitting: Physical Therapy

## 2017-11-26 DIAGNOSIS — R262 Difficulty in walking, not elsewhere classified: Secondary | ICD-10-CM

## 2017-11-26 DIAGNOSIS — M545 Low back pain, unspecified: Secondary | ICD-10-CM

## 2017-11-26 DIAGNOSIS — M6281 Muscle weakness (generalized): Secondary | ICD-10-CM

## 2017-11-26 NOTE — Therapy (Signed)
[name], [place], [address] Phone: [phone]   Fax:  [phone]  Physical Therapy Treatment  Patient Details  Name: Gabriela Rush MRN: [id number] Date of Birth: 1966-01-10 Referring Provider: Arlester Marker, APRN (from [facility] in [place], [place]; PCP Consuello Masse, MD)   Encounter Date: 11/26/2017  PT End of Session - 11/26/17 1342    Visit Number  11    Number of Visits  13    Date for PT Re-Evaluation  12/03/17   mini reassess completed 11/17/17 with 10th visit update   Authorization Type  BCBS Other    Authorization Time Period  10/22/17 to 12/03/17, 20 appointment limit.      Authorization - Visit Number  11    Authorization - Number of Visits  20   will update as necessary; pt stating she thought that she was allowed up to 20 visits so will have front office double check as nothing was reported mentioning a visit limit   PT Start Time  1305    PT Stop Time  1348    PT Time Calculation (min)  43 min    Activity Tolerance  Patient tolerated treatment well;No increased pain    Behavior During Therapy  WFL for tasks assessed/performed       Past Medical History:  Diagnosis Date  . Diabetes mellitus without complication ([place])   . Family history of adverse reaction to anesthesia    Mother had problem with anesthesia; The "lost" her during a surgery; brought her back.  . Hypertension     Past Surgical History:  Procedure Laterality Date  . BREAST REDUCTION SURGERY    . CESAREAN SECTION  1988  . COLONOSCOPY N/A 10/20/2016   Procedure: COLONOSCOPY;  Surgeon: Danie Binder, MD;  [facility];  Service: Endoscopy;  Laterality: N/A;  1:00 pm    There were no vitals filed for this visit.  Subjective Assessment - 11/26/17 1310    Subjective  PT states she is sore from the new exercises from last visit.  Soreness is in her glutes.  No real pain.    Currently in Pain?  No/denies                        [facility] Adult PT Treatment/Exercise - 11/26/17 0001      Lumbar Exercises: Standing   Heel Raises  Limitations;20 reps    Heel Raises Limitations  toe raises, 20 reps, 3 sec holds, on decline    Functional Squats  15 reps    Functional Squats Limitations  2 sets, chair taps with pillow on it    Other Standing Lumbar Exercises  hip excursions 10 reps each      Lumbar Exercises: Supine   Bridge with Ball Squeeze  15 reps;3 seconds    Bridge with [employer] Limitations  2 sets, to deter external rotation at hips    Straight Leg Raise  15 reps;Limitations    Straight Leg Raises Limitations  bil LE 2 sets      Lumbar Exercises: Sidelying   Clam  Both;3 seconds;Limitations;15 reps    Clam Limitations  GTB 2 sets each      Lumbar Exercises: Prone   Straight Leg Raise  10 reps;Limitations    Straight Leg Raises Limitations  2 sets      Manual Therapy   Manual Therapy  Soft tissue mobilization    Manual therapy  comments  completed at end of session     Soft tissue mobilization  prone to Lt glute/site of surgery to decrease scar tissue and discomfort          Balance Exercises - 11/26/17 1312      Balance Exercises: Standing   SLS with Vectors  Solid surface;Limitations    Tandem Gait  Forward;2 reps;Limitations   on balance beam   Sidestepping  2 reps;Theraband          PT Short Term Goals - 11/17/17 1303      PT SHORT TERM GOAL #1   Title  Pt will be independent with HEP and perform consistently in order to decrease pain and maximize return to PLOF.    Time  4    Period  Weeks    Status  Achieved      PT SHORT TERM GOAL #2   Title  Pt will have improved BLE MMT by 1/2 grade throughout in order to maximize gait and balance.    Baseline  8/20: see MMT    Time  3    Period  Weeks    Status  Partially Met      PT SHORT TERM GOAL #3   Title  Pt will be able to perform R SLS for 10 sec or > without Trendelenberg sign in order to  demo improved functional glute med strength so as to maximize her gait and stair ambulation.    Baseline  8/20: 2 sec or < still with Trendelenberg    Time  3    Period  Weeks    Status  On-going      PT SHORT TERM GOAL #4   Title  Pt will be able to perform 12 STS during 30sec chair rise test in order to demo improved balance and functional strength.     Baseline  8/20: 15x    Time  3    Status  Achieved        PT Long Term Goals - 11/17/17 1303      PT LONG TERM GOAL #1   Title  Pt will have improved BLE MMT by 1 grade throughout in order to further maximize gait and balance and promote return to PLOF.    Baseline  8/20: see MMT    Time  6    Period  Weeks    Status  On-going      PT LONG TERM GOAL #2   Title  Pt will be able to perform bil SLS for 20 sec or > without Trendelenberg sign in order to demo further improved core, functional, and BLE strength so as to maximize her gait on uneven ground and stair ambulation.    Baseline  8/20: L: 25sec, RL 2 sec or <    Time  6    Period  Weeks    Status  Partially Met      PT LONG TERM GOAL #3   Title  Pt will have 166f improvement during 3MWT with gait mechanics WFL and without signs of R Trendelenberg sign to demo improved functional strength and overall functional mobility.    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    Baseline  8/20: hasn't really tried, but states she can walk for 1 hour; feels she could probably stand for 25-29mns now though    Time  6    Period  Weeks    Status  On-going            Plan - 11/26/17 1408    Clinical Impression Statement   Continued strengthening for bilateral LE's and core musculature.  Progressed tandem gait to use of balance beam to increase challenge and added lunges to work on LE stability .  Manual completed at end of session, deep tissue to scar on Lt hip.  Noted thick scar tissue with ability to resolve and loosen.  Pt ambulated without antalgia following (walked in limping) and reported it felt so much better.      Rehab Potential  Good    PT Frequency  2x / week    PT Duration  6 weeks    PT Treatment/Interventions  ADLs/Self Care Home Management;Cryotherapy;Electrical Stimulation;Moist Heat;Ultrasound;DME Instruction;Gait training;Functional mobility training;Therapeutic activities;Stair training;Therapeutic exercise;Balance training;Neuromuscular re-education;Patient/family education;Manual techniques;Passive range of motion;Scar mobilization;Dry needling;Taping   per face sheet, aquatic therapy and manual therapy not covered by insurance   PT Next Visit Plan  Ccontinue to progress core, postural, BLE, and functional strengthening as tolerated and within BLT lumbar precautions.  Next session increase to GTB with side stepping.  Continue manual to tight scar on Lt hip/lumbar region.    PT Home Exercise Plan  eval: supine ab set, seated heel and toe raises; 8/20: hip hinge with dowel    Consulted and Agree with Plan of Care  Patient       Patient will benefit from skilled therapeutic intervention in order to improve the following deficits and impairments:  Abnormal gait, Decreased activity tolerance, Decreased balance, Decreased endurance, Decreased mobility, Decreased range of motion, Decreased strength, Difficulty walking, Hypomobility, Increased fascial restricitons, Increased muscle spasms, Impaired flexibility, Impaired sensation, Improper body mechanics, Postural dysfunction, Pain  Visit Diagnosis: Acute bilateral low back pain without sciatica  Muscle weakness (generalized)  Difficulty in walking,  not elsewhere classified     Problem List Patient Active Problem List   Diagnosis Date Noted  . Special screening for malignant neoplasms, colon    ATeena Irani PTA/CLT [phone] FTeena Irani8/29/2019, 2:11 PM  [place] [address]Phone: [phone]  Fax:  [phone] Name: LSumie RemsenMRN: [id number] of Birth: 4June 21, 1967

## 2017-12-01 ENCOUNTER — Other Ambulatory Visit (HOSPITAL_COMMUNITY): Payer: Self-pay | Admitting: Neurosurgery

## 2017-12-01 ENCOUNTER — Encounter (HOSPITAL_COMMUNITY): Payer: Self-pay

## 2017-12-01 ENCOUNTER — Ambulatory Visit (HOSPITAL_COMMUNITY): Payer: BLUE CROSS/BLUE SHIELD | Attending: Nurse Practitioner

## 2017-12-01 ENCOUNTER — Ambulatory Visit (HOSPITAL_COMMUNITY)
Admission: RE | Admit: 2017-12-01 | Discharge: 2017-12-01 | Disposition: A | Discharge status: Home / Self Care | Payer: BLUE CROSS/BLUE SHIELD | Source: Ambulatory Visit | Attending: Neurosurgery | Admitting: Neurosurgery

## 2017-12-01 DIAGNOSIS — Z981 Arthrodesis status: Secondary | ICD-10-CM | POA: Insufficient documentation

## 2017-12-01 DIAGNOSIS — M6281 Muscle weakness (generalized): Secondary | ICD-10-CM | POA: Diagnosis present

## 2017-12-01 DIAGNOSIS — R262 Difficulty in walking, not elsewhere classified: Secondary | ICD-10-CM | POA: Diagnosis present

## 2017-12-01 DIAGNOSIS — M545 Low back pain, unspecified: Secondary | ICD-10-CM

## 2017-12-01 NOTE — Therapy (Signed)
Gabriela Rush, [place], [address] Phone: [phone]   Fax:  [phone]  Physical Therapy Treatment  Patient Details  Name: Gabriela Rush MRN: [id number] Date of Birth: 10-02-1965 Referring Provider: Arlester Marker, APRN (from [facility] in [place], [place]; PCP Consuello Masse, MD)   Encounter Date: 12/01/2017  PT End of Session - 12/01/17 1306    Visit Number  12    Number of Visits  13    Date for PT Re-Evaluation  12/03/17    Authorization Type  BCBS Other    Authorization Time Period  10/22/17 to 12/03/17, 20 appointment limit.      Authorization - Visit Number  12    Authorization - Number of Visits  20    PT Start Time  1301    PT Stop Time  1340    PT Time Calculation (min)  39 min       Past Medical History:  Diagnosis Date  . Diabetes mellitus without complication ([place])   . Family history of adverse reaction to anesthesia    Mother had problem with anesthesia; The "lost" her during a surgery; brought her back.  . Hypertension     Past Surgical History:  Procedure Laterality Date  . BREAST REDUCTION SURGERY    . CESAREAN SECTION  1988  . COLONOSCOPY N/A 10/20/2016   Procedure: COLONOSCOPY;  Surgeon: Danie Binder, MD;  [facility];  Service: Endoscopy;  Laterality: N/A;  1:00 pm    There were no vitals filed for this visit.  Subjective Assessment - 12/01/17 1305    Subjective  Pt stated she is sore from the exercises, no real pain today.  Reports she felt a lot better following massage last session.  Reports she had xray taken today, will transfer info to MD in [place] for RTW.    Pertinent History  L4-5 TLIF on 09/09/17 at [facility] in [place], [place]    Patient Stated Goals  be able to bend again    Currently in Pain?  No/denies                       [facility] Adult PT Treatment/Exercise - 12/01/17 0001      Exercises   Exercises  Lumbar      Lumbar Exercises: Standing    Other Standing Lumbar Exercises  vector stance 3x5"      Lumbar Exercises: Supine   Bridge with Ball Squeeze  15 reps;3 seconds    Bridge with [employer] Limitations  2 sets, to deter external rotation at hips      Lumbar Exercises: Sidelying   Hip Abduction  Both;15 reps      Lumbar Exercises: Quadruped   Single Arm Raise  5 reps;3 seconds    Straight Leg Raise  5 reps;3 seconds      Manual Therapy   Manual Therapy  Soft tissue mobilization    Manual therapy comments  completed at end of session     Soft tissue mobilization  prone to Lt glute/site of surgery to decrease scar tissue and discomfort          Balance Exercises - 12/01/17 1343      Balance Exercises: Standing   SLS with Vectors  Solid surface;Limitations;3 reps   3x5" holds wiht intermittent HHA   Tandem Gait  --   time, resume next session   Sidestepping  2 reps;Theraband   GTB  PT Short Term Goals - 11/17/17 1303      PT SHORT TERM GOAL #1   Title  Pt will be independent with HEP and perform consistently in order to decrease pain and maximize return to PLOF.    Time  4    Period  Weeks    Status  Achieved      PT SHORT TERM GOAL #2   Title  Pt will have improved BLE MMT by 1/2 grade throughout in order to maximize gait and balance.    Baseline  8/20: see MMT    Time  3    Period  Weeks    Status  Partially Met      PT SHORT TERM GOAL #3   Title  Pt will be able to perform R SLS for 10 sec or > without Trendelenberg sign in order to demo improved functional glute med strength so as to maximize her gait and stair ambulation.    Baseline  8/20: 2 sec or < still with Trendelenberg    Time  3    Period  Weeks    Status  On-going      PT SHORT TERM GOAL #4   Title  Pt will be able to perform 12 STS during 30sec chair rise test in order to demo improved balance and functional strength.     Baseline  8/20: 15x    Time  3    Status  Achieved        PT Long Term Goals - 11/17/17  1303      PT LONG TERM GOAL #1   Title  Pt will have improved BLE MMT by 1 grade throughout in order to further maximize gait and balance and promote return to PLOF.    Baseline  8/20: see MMT    Time  6    Period  Weeks    Status  On-going      PT LONG TERM GOAL #2   Title  Pt will be able to perform bil SLS for 20 sec or > without Trendelenberg sign in order to demo further improved core, functional, and BLE strength so as to maximize her gait on uneven ground and stair ambulation.    Baseline  8/20: L: 25sec, RL 2 sec or <    Time  6    Period  Weeks    Status  Partially Met      PT LONG TERM GOAL #3   Title  Pt will have 151f improvement during 3MWT with gait mechanics WFL and without signs of R Trendelenberg sign to demo improved functional strength and overall functional mobility.    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    Baseline  8/20: hasn't really tried, but states she can walk for 1 hour; feels she could probably stand for 25-41m65mnow though    Time  6    Period  Weeks    Status  On-going  Plan - 12/01/17 1358    Clinical Impression Statement  Session focus wiht gluteal and core strengthening.  Progressed to quadruped exercises for lumbar stability wiht cueing for mechanics.  EOS wiht manual to address adhesions on scar tissue and  tightness in hip musculature, noted improved gait mechanics and no reports of pain.    Rehab Potential  Good    PT Frequency  2x / week    PT Duration  6 weeks    PT Treatment/Interventions  ADLs/Self Care Home Management;Cryotherapy;Electrical Stimulation;Moist  Heat;Ultrasound;DME Instruction;Gait training;Functional mobility training;Therapeutic activities;Stair training;Therapeutic exercise;Balance training;Neuromuscular re-education;Patient/family education;Manual techniques;Passive range of motion;Scar mobilization;Dry needling;Taping    PT Next Visit Plan  Ccontinue to progress core, postural, BLE, and functional strengthening as tolerated and within BLT lumbar precautions.  Continue manual to tight scar on Lt hip/lumbar region.    PT Home Exercise Plan  eval: supine ab set, seated heel and toe raises; 8/20: hip hinge with dowel       Patient will benefit from skilled therapeutic intervention in order to improve the following deficits and impairments:  Abnormal gait, Decreased activity tolerance, Decreased balance, Decreased endurance, Decreased mobility, Decreased range of motion, Decreased strength, Difficulty walking, Hypomobility, Increased fascial restricitons, Increased muscle spasms, Impaired flexibility, Impaired sensation, Improper body mechanics, Postural dysfunction, Pain  Visit Diagnosis: Acute bilateral low back pain without sciatica  Muscle weakness (generalized)  Difficulty in walking, not elsewhere classified     Problem List Patient Active Problem List   Diagnosis Date Noted  . Special screening for malignant neoplasms, colon    Ihor Austin, LPTA; [place]  Aldona Lento 12/01/2017, 5:09 PM  [place] [address] [place], [place], [address] Phone: [phone]   Fax:  [phone]  Name: Gabriela Rush MRN: [id number] Date of Birth: 09-Aug-1965

## 2017-12-03 ENCOUNTER — Encounter (HOSPITAL_COMMUNITY): Payer: Self-pay

## 2017-12-03 ENCOUNTER — Ambulatory Visit (HOSPITAL_COMMUNITY): Payer: BLUE CROSS/BLUE SHIELD

## 2017-12-03 DIAGNOSIS — M545 Low back pain, unspecified: Secondary | ICD-10-CM

## 2017-12-03 DIAGNOSIS — R262 Difficulty in walking, not elsewhere classified: Secondary | ICD-10-CM

## 2017-12-03 DIAGNOSIS — M6281 Muscle weakness (generalized): Secondary | ICD-10-CM

## 2017-12-03 NOTE — Therapy (Signed)
[place], [place], [address] Phone: [phone]   Fax:  [phone]   Progress Note Reporting Period 11/17/17 to 12/03/17  See note below for Objective Data and Assessment of Progress/Goals.   Physical Therapy Treatment  Patient Details  Name: Gabriela Rush MRN: [id number] Date of Birth: 03-09-1966 Referring Provider: Arlester Marker, APRN (Surgeon: Dr. Bridgett Larsson)   Encounter Date: 12/03/2017  PT End of Session - 12/03/17 1259    Visit Number  13    Number of Visits  19    Date for PT Re-Evaluation  12/24/17    Authorization Type  BCBS Other    Authorization Time Period  10/22/17 to 12/03/17; NEW: 9/5/19to 12/24/17 (seeking approval for more authorized visits from insurance)    Authorization - Visit Number  13    Authorization - Number of Visits  20    PT Start Time  1300    PT Stop Time  1340    PT Time Calculation (min)  40 min    Activity Tolerance  Patient tolerated treatment well;No increased pain    Behavior During Therapy  WFL for tasks assessed/performed       Past Medical History:  Diagnosis Date  . Diabetes mellitus without complication ([place])   . Family history of adverse reaction to anesthesia    Mother had problem with anesthesia; The "lost" her during a surgery; brought her back.  . Hypertension     Past Surgical History:  Procedure Laterality Date  . BREAST REDUCTION SURGERY    . CESAREAN SECTION  1988  . COLONOSCOPY N/A 10/20/2016   Procedure: COLONOSCOPY;  Surgeon: Danie Binder, MD;  [facility];  Service: Endoscopy;  Laterality: N/A;  1:00 pm    There were no vitals filed for this visit.  Subjective Assessment - 12/03/17 1300    Subjective  Pt states that the manual is really helping her and that she thinks it is helping her feeling come back in her R leg.    Pertinent History  L4-5 TLIF on 09/09/17 at [facility] in [place], [place]    Patient Stated Goals  be able to bend again     Currently in Pain?  Yes    Pain Score  1     Pain Location  Back    Pain Orientation  Left    Pain Descriptors / Indicators  Aching    Pain Type  Chronic pain;Surgical pain    Pain Onset  More than a month ago    Pain Frequency  Intermittent    Aggravating Factors   just if overdo it    Pain Relieving Factors  rest         [facility] PT Assessment - 12/03/17 0001      Assessment   Medical Diagnosis  Back pain    Referring Provider  Arlester Marker, APRN   Surgeon: Dr. Bridgett Larsson   Onset Date/Surgical Date  09/09/17    Next MD Visit  nothing scheduled yet    Prior Therapy  none      Precautions   Precautions  Back      Strength   Right Hip Extension  4/5   was 4-   Right Hip ABduction  4-/5   was 3+   Left Hip Extension  4+/5   was 4   Left Hip ABduction  4+/5   was 4   Right Ankle Dorsiflexion  4-/5   was 3+  Ambulation/Gait   Ambulation Distance (Feet)  780 Feet   was 762f no AD   Assistive device  None    Gait Pattern  Step-through pattern;Decreased dorsiflexion - right;Trendelenburg   decreased pelvic control and minor R foot drop   Gait Comments  minor foot drop on R due to anterior tib weakness      Balance   Balance Assessed  Yes      Static Standing Balance   Static Standing - Balance Support  No upper extremity supported    Static Standing Balance -  Activities   Single Leg Stance - Right Leg    Static Standing - Comment/# of Minutes  R: 12.3sec or <, min trendelenberg   was 2 sec on R and 25sec on L             PT Education - 12/03/17 1300    Education Details  reassessment findings    Person(s) Educated  Patient    Methods  Explanation    Comprehension  Verbalized understanding       PT Short Term Goals - 12/03/17 1302      PT SHORT TERM GOAL #1   Title  Pt will be independent with HEP and perform consistently in order to decrease pain and maximize return to PLOF.    Time  4    Period  Weeks    Status  Achieved      PT SHORT TERM  GOAL #2   Title  Pt will have improved BLE MMT by 1/2 grade throughout in order to maximize gait and balance.    Baseline  9/5: see MMT    Time  3    Period  Weeks    Status  Achieved      PT SHORT TERM GOAL #3   Title  Pt will be able to perform R SLS for 10 sec or > without Trendelenberg sign in order to demo improved functional glute med strength so as to maximize her gait and stair ambulation.    Baseline  9/5: 12.3sec, mild Trendelenberg    Time  3    Period  Weeks    Status  Partially Met      PT SHORT TERM GOAL #4   Title  Pt will be able to perform 12 STS during 30sec chair rise test in order to demo improved balance and functional strength.     Baseline  8/20: 15x    Time  3    Status  Achieved        PT Long Term Goals - 12/03/17 1303      PT LONG TERM GOAL #1   Title  Pt will have improved BLE MMT by 1 grade throughout in order to further maximize gait and balance and promote return to PLOF.    Baseline  9/5: see MMT    Time  6    Period  Weeks    Status  Partially Met      PT LONG TERM GOAL #2   Title  Pt will be able to perform bil SLS for 20 sec or > without Trendelenberg sign in order to demo further improved core, functional, and BLE strength so as to maximize her gait on uneven ground and stair ambulation.    Baseline  8/20: L: 25sec, R: 12 sec or <    Time  6    Period  Weeks    Status  Partially Met  PT LONG TERM GOAL #3   Title  Pt will have 113f improvement during 3MWT with gait mechanics WFL and without signs of R Trendelenberg sign to demo improved functional strength and overall functional mobility.    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    Baseline  9/5: can stand for 1 hour and then her pain increases    Time  6    Period  Weeks    Status  Achieved            Plan - 12/03/17 1343    Clinical Impression Statement  PT reassessed pt's goals and outcome measures this date. Pt has made great progress towards all goals as illustrated above. Her overall strength has improved by 1/2 or > throughout, her functional strength and mobility have improved, and her tolerance to standing and walking have alos improved. She still has weak hip abd AEB minimal Trendelenberg sign during SLS. She also still does not feel like her lifting is sufficient to return to work. PT recommends continuing therapy for 2x/week for 3 more weeks (will f/u with her insurance on getting more than 20 visits approved) in order to continue to address her R hip strength, R ankle strength, core strength, and manual to decrease soft tissue restrictions in order to maximize return to PLOF and work duties. Performed proper lifting technique with 8# box which required cues for form but good carry-over and ended with manual STM to bil lumbar paraspinals to address scar tissue. Pt reported no pain at EOS and her gait was evidently better following the manual.     Rehab Potential  Good    PT Frequency  2x / week    PT Duration  6 weeks    PT Treatment/Interventions  ADLs/Self Care Home Management;Cryotherapy;Electrical Stimulation;Moist Heat;Ultrasound;DME Instruction;Gait training;Functional mobility training;Therapeutic activities;Stair training;Therapeutic exercise;Balance training;Neuromuscular re-education;Patient/family education;Manual techniques;Passive range of motion;Scar mobilization;Dry needling;Taping    PT Next Visit Plan  Continue manual to tight scar on Lt hip/lumbar region. progress core, hip, and ankle strengthening as tolerated and within BLT lumbar precautions.    PT Home Exercise Plan   eval: supine ab set, seated heel and toe raises; 8/20: hip hinge with dowel; 9/5: ab set with march    Consulted and Agree with Plan of Care  Patient       Patient will benefit from skilled therapeutic intervention in order to improve the following deficits and impairments:  Abnormal gait, Decreased activity tolerance, Decreased balance, Decreased endurance, Decreased mobility, Decreased range of motion, Decreased strength, Difficulty walking, Hypomobility, Increased fascial restricitons, Increased muscle spasms, Impaired flexibility, Impaired sensation, Improper body mechanics, Postural dysfunction, Pain  Visit Diagnosis: Acute bilateral low back pain without sciatica - Plan: PT plan of care cert/re-cert  Muscle weakness (generalized) - Plan: PT plan of care cert/re-cert  Difficulty in walking, not elsewhere classified - Plan: PT plan of care cert/re-cert     Problem List Patient Active Problem List   Diagnosis Date Noted  . Special screening  for malignant neoplasms, colon         Geraldine Solar PT, DPT  [place] [address] [place], [place], [address] Phone: [phone]   Fax:  [phone]  Name: Brynnan Rodenbaugh MRN: [id number] Date of Birth: January 18, 1966

## 2017-12-03 NOTE — Addendum Note (Signed)
Addended by: Geralyn Corwin on: 12/03/2017 01:51 PM   Modules accepted: Orders

## 2017-12-07 ENCOUNTER — Telehealth (HOSPITAL_COMMUNITY): Payer: Self-pay | Admitting: Family Medicine

## 2017-12-07 NOTE — Telephone Encounter (Signed)
12/07/17  pt left a message to cx she is going to her cousin's funeral which is at 47 and then has to be back at the school to pick up

## 2017-12-08 ENCOUNTER — Ambulatory Visit (HOSPITAL_COMMUNITY): Payer: BLUE CROSS/BLUE SHIELD | Admitting: Physical Therapy

## 2017-12-10 ENCOUNTER — Ambulatory Visit (HOSPITAL_COMMUNITY): Payer: BLUE CROSS/BLUE SHIELD | Admitting: Physical Therapy

## 2017-12-10 ENCOUNTER — Encounter (HOSPITAL_COMMUNITY): Payer: Self-pay | Admitting: Physical Therapy

## 2017-12-10 DIAGNOSIS — M545 Low back pain, unspecified: Secondary | ICD-10-CM

## 2017-12-10 DIAGNOSIS — M6281 Muscle weakness (generalized): Secondary | ICD-10-CM

## 2017-12-10 DIAGNOSIS — R262 Difficulty in walking, not elsewhere classified: Secondary | ICD-10-CM

## 2017-12-10 NOTE — Therapy (Signed)
[place], [place], [address] Phone: [phone]   Fax:  [phone]  Physical Therapy Treatment  Patient Details  Name: Gabriela Rush MRN: [id number] Date of Birth: [birth year] Referring Provider: Arlester Marker, APRN (Surgeon: Dr. Bridgett Larsson)   Encounter Date: 12/10/2017  PT End of Session - 12/10/17 1305    Visit Number  14    Number of Visits  19    Date for PT Re-Evaluation  12/24/17    Authorization Type  [employer] Other    Authorization Time Period  10/22/17 to 12/03/17; NEW: 9/5/19to 12/24/17 (seeking approval for more authorized visits from insurance)    Authorization - Visit Number  14    Authorization - Number of Visits  20    PT Start Time  1302    PT Stop Time  1340    PT Time Calculation (min)  38 min    Equipment Utilized During Treatment  Gait belt    Activity Tolerance  Patient tolerated treatment well;No increased pain    Behavior During Therapy  WFL for tasks assessed/performed       Past Medical History:  Diagnosis Date  . Diabetes mellitus without complication ([place])   . Family history of adverse reaction to anesthesia    Mother had problem with anesthesia; The "lost" her during a surgery; brought her back.  . Hypertension     Past Surgical History:  Procedure Laterality Date  . BREAST REDUCTION SURGERY    . CESAREAN SECTION  1988  . COLONOSCOPY N/A 10/20/2016   Procedure: COLONOSCOPY;  Surgeon: Danie Binder, MD;  [facility];  Service: Endoscopy;  Laterality: N/A;  1:00 pm    There were no vitals filed for this visit.  Subjective Assessment - 12/10/17 1304    Subjective  Patient reported that she is just having some mild low back pain.     Pertinent History  L4-5 TLIF on 09/09/17 at [facility] in [place], [place]    Patient Stated Goals  be able to bend again    Currently in Pain?  Yes    Pain Score  2     Pain Location  Back    Pain Orientation  Lower;Medial    Pain Descriptors  / Indicators  Aching    Pain Type  Chronic pain    Pain Onset  More than a month ago                       [facility] Adult PT Treatment/Exercise - 12/10/17 0001      Lumbar Exercises: Standing   Heel Raises  Limitations;20 reps    Heel Raises Limitations  Toe raises 2 x 10 repetitions      Lumbar Exercises: Supine   Bridge with Ball Squeeze  15 reps;3 seconds    Bridge with [employer] Limitations  2 sets, to deter external rotation at hips      Lumbar Exercises: Sidelying   Hip Abduction  Both;15 reps      Lumbar Exercises: Prone   Straight Leg Raise  10 reps;Limitations    Straight Leg Raises Limitations  2 sets      Lumbar Exercises: Quadruped   Single Arm Raise  5 reps;3 seconds    Straight Leg Raise  5 reps;3 seconds    Other Quadruped Lumbar Exercises  Bird dogs x 5 each side      Manual Therapy   Manual Therapy  Soft tissue mobilization    Manual therapy comments  completed at end of session     Soft tissue mobilization  prone to bil paraspinals to address restrictions and scar tissue + very light STM to decrease pain          Balance Exercises - 12/10/17 1308      Balance Exercises: Standing   SLS with Vectors  Solid surface;Limitations;3 reps   5'' hold intermittent HHA   Tandem Gait  Forward;2 reps;Limitations   14 feet x 2   Sidestepping  2 reps;Theraband   14 feet x 2 RT GTB       PT Education - 12/10/17 1305    Education Details  Purpose and technique of interventions throughout session.     Person(s) Educated  Patient    Methods  Explanation    Comprehension  Verbalized understanding       PT Short Term Goals - 12/03/17 1302      PT SHORT TERM GOAL #1   Title  Pt will be independent with HEP and perform consistently in order to decrease pain and maximize return to PLOF.    Time  4    Period  Weeks    Status  Achieved      PT SHORT TERM GOAL #2   Title  Pt will have improved BLE MMT by 1/2 grade throughout in order to  maximize gait and balance.    Baseline  9/5: see MMT    Time  3    Period  Weeks    Status  Achieved      PT SHORT TERM GOAL #3   Title  Pt will be able to perform R SLS for 10 sec or > without Trendelenberg sign in order to demo improved functional glute med strength so as to maximize her gait and stair ambulation.    Baseline  9/5: 12.3sec, mild Trendelenberg    Time  3    Period  Weeks    Status  Partially Met      PT SHORT TERM GOAL #4   Title  Pt will be able to perform 12 STS during 30sec chair rise test in order to demo improved balance and functional strength.     Baseline  8/20: 15x    Time  3    Status  Achieved        PT Long Term Goals - 12/03/17 1303      PT LONG TERM GOAL #1   Title  Pt will have improved BLE MMT by 1 grade throughout in order to further maximize gait and balance and promote return to PLOF.    Baseline  9/5: see MMT    Time  6    Period  Weeks    Status  Partially Met      PT LONG TERM GOAL #2   Title  Pt will be able to perform bil SLS for 20 sec or > without Trendelenberg sign in order to demo further improved core, functional, and BLE strength so as to maximize her gait on uneven ground and stair ambulation.    Baseline  8/20: L: 25sec, R: 12 sec or <    Time  6    Period  Weeks    Status  Partially Met      PT LONG TERM GOAL #3   Title  Pt will have 13f improvement during 3MWT with gait mechanics WFL and without signs of R Trendelenberg sign to demo  improved functional strength and overall functional mobility.    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    Baseline  9/5:  can stand for 1 hour and then her pain increases    Time  6    Period  Weeks    Status  Achieved            Plan - 12/10/17 1345    Clinical Impression Statement  This session continued with established plan of care. This session was able to continue with tandem ambulation. This session added bird dogs to challenge patient's abdominal stabilization further.  This session also included toe raises to improve patient's dorsiflexion strength. Ended session with soft tissue mobilization to improve relaxation, and improve mobility.     Rehab Potential  Good    PT Frequency  2x / week    PT Duration  3 weeks    PT Treatment/Interventions  ADLs/Self Care Home Management;Cryotherapy;Electrical Stimulation;Moist Heat;Ultrasound;DME Instruction;Gait training;Functional mobility training;Therapeutic activities;Stair training;Therapeutic exercise;Balance training;Neuromuscular re-education;Patient/family education;Manual techniques;Passive range of motion;Scar mobilization;Dry needling;Taping    PT Next Visit Plan  Continue manual to tight scar on Lt hip/lumbar region. progress core, hip, and ankle strengthening as tolerated and within BLT lumbar precautions.    PT Home Exercise Plan  eval: supine ab set, seated heel and toe raises; 8/20: hip hinge with dowel; 9/5: ab set with march    Consulted and Agree with Plan of Care  Patient       Patient will benefit from skilled therapeutic intervention in order to improve the following deficits and impairments:  Abnormal gait, Decreased activity tolerance, Decreased balance, Decreased endurance, Decreased mobility, Decreased range of motion, Decreased strength, Difficulty walking, Hypomobility, Increased fascial restricitons, Increased muscle spasms, Impaired flexibility, Impaired sensation, Improper body mechanics, Postural dysfunction, Pain  Visit Diagnosis: Acute bilateral low back pain without sciatica  Muscle weakness (generalized)  Difficulty in  walking, not elsewhere classified     Problem List Patient Active Problem List   Diagnosis Date Noted  . Special screening for malignant neoplasms, colon    MacClarene Critchley, DPT 1:47 PM, 12/10/17 [address] [place]one: [phone]:  [phone]: LisSloka VolanteN: [id number] of Birth: 4/611-21-1967

## 2017-12-15 ENCOUNTER — Telehealth (HOSPITAL_COMMUNITY): Payer: Self-pay

## 2017-12-15 ENCOUNTER — Ambulatory Visit (HOSPITAL_COMMUNITY): Payer: BLUE CROSS/BLUE SHIELD | Admitting: Physical Therapy

## 2017-12-15 ENCOUNTER — Telehealth (HOSPITAL_COMMUNITY): Payer: Self-pay | Admitting: Physical Therapy

## 2017-12-15 DIAGNOSIS — R262 Difficulty in walking, not elsewhere classified: Secondary | ICD-10-CM

## 2017-12-15 DIAGNOSIS — M545 Low back pain, unspecified: Secondary | ICD-10-CM

## 2017-12-15 DIAGNOSIS — M6281 Muscle weakness (generalized): Secondary | ICD-10-CM

## 2017-12-15 NOTE — Telephone Encounter (Signed)
[phone] VQM#[id number] called cs manager back Janett Billow) l/m requested return phone call to verify approval for more visit for this patient. NF 12/15/17

## 2017-12-15 NOTE — Therapy (Signed)
[place], [place], [address] Phone: [phone]   Fax:  [phone]  Physical Therapy Treatment  Patient Details  Name: Gabriela Rush MRN: [id number] Date of Birth: 1966/01/14 Referring Provider: Arlester Marker, APRN (Surgeon: Dr. Bridgett Larsson)   Encounter Date: 12/15/2017  PT End of Session - 12/15/17 1201    Visit Number  15    Number of Visits  19    Date for PT Re-Evaluation  12/24/17    Authorization Type  BCBS Other    Authorization Time Period  10/22/17 to 12/03/17; NEW: 9/5/19to 12/24/17 (seeking approval for more authorized visits from insurance)    Authorization - Visit Number  15    Authorization - Number of Visits  20    PT Start Time  1118    PT Stop Time  1200    PT Time Calculation (min)  42 min    Equipment Utilized During Treatment  Gait belt    Activity Tolerance  Patient tolerated treatment well;No increased pain    Behavior During Therapy  WFL for tasks assessed/performed       Past Medical History:  Diagnosis Date  . Diabetes mellitus without complication ([place])   . Family history of adverse reaction to anesthesia    Mother had problem with anesthesia; The "lost" her during a surgery; brought her back.  . Hypertension     Past Surgical History:  Procedure Laterality Date  . BREAST REDUCTION SURGERY    . CESAREAN SECTION  1988  . COLONOSCOPY N/A 10/20/2016   Procedure: COLONOSCOPY;  Surgeon: Danie Binder, MD;  [facility];  Service: Endoscopy;  Laterality: N/A;  1:00 pm    There were no vitals filed for this visit.  Subjective Assessment - 12/15/17 1120    Subjective  Pt states she is doing well today.  Reports no real back pain today.    Currently in Pain?  No/denies                       [facility] Adult PT Treatment/Exercise - 12/15/17 0001      Lumbar Exercises: Standing   Heel Raises  Limitations;20 reps    Heel Raises Limitations  Toe raises 2 x 10  repetitions    Forward Lunge  15 reps;Limitations    Forward Lunge Limitations  onto 4" step no UE's    Wall Slides  10 reps;Limitations    Wall Slides Limitations  5" holds     Other Standing Lumbar Exercises  proper lifting technique 8# x5 floor to waist and OH      Lumbar Exercises: Supine   Bridge with Ball Squeeze  15 reps;3 seconds    Bridge with [employer] Limitations  2 sets, to deter external rotation at hips      Lumbar Exercises: Sidelying   Hip Abduction  Both;15 reps;Limitations    Hip Abduction Limitations  2 sets      Lumbar Exercises: Prone   Straight Leg Raise  15 reps    Straight Leg Raises Limitations  2 sets             PT Education - 12/15/17 1203    Education Details  informed by Geraldine Solar, PT she will need to contact her insurance company for more PT visits    Person(s) Educated  Patient    Methods  Explanation    Comprehension  Verbalized understanding  PT Short Term Goals - 12/03/17 1302      PT SHORT TERM GOAL #1   Title  Pt will be independent with HEP and perform consistently in order to decrease pain and maximize return to PLOF.    Time  4    Period  Weeks    Status  Achieved      PT SHORT TERM GOAL #2   Title  Pt will have improved BLE MMT by 1/2 grade throughout in order to maximize gait and balance.    Baseline  9/5: see MMT    Time  3    Period  Weeks    Status  Achieved      PT SHORT TERM GOAL #3   Title  Pt will be able to perform R SLS for 10 sec or > without Trendelenberg sign in order to demo improved functional glute med strength so as to maximize her gait and stair ambulation.    Baseline  9/5: 12.3sec, mild Trendelenberg    Time  3    Period  Weeks    Status  Partially Met      PT SHORT TERM GOAL #4   Title  Pt will be able to perform 12 STS during 30sec chair rise test in order to demo improved balance and functional strength.     Baseline  8/20: 15x    Time  3    Status  Achieved        PT Long  Term Goals - 12/03/17 1303      PT LONG TERM GOAL #1   Title  Pt will have improved BLE MMT by 1 grade throughout in order to further maximize gait and balance and promote return to PLOF.    Baseline  9/5: see MMT    Time  6    Period  Weeks    Status  Partially Met      PT LONG TERM GOAL #2   Title  Pt will be able to perform bil SLS for 20 sec or > without Trendelenberg sign in order to demo further improved core, functional, and BLE strength so as to maximize her gait on uneven ground and stair ambulation.    Baseline  8/20: L: 25sec, R: 12 sec or <    Time  6    Period  Weeks    Status  Partially Met      PT LONG TERM GOAL #3   Title  Pt will have 130f improvement during 3MWT with gait mechanics WFL and without signs of R Trendelenberg sign to demo improved functional strength and overall functional mobility.    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    Baseline  9/5: can stand for 1 hour and then her pain increases    Time  6    Period  Weeks    Status  Achieved            Plan - 12/15/17 1201    Clinical Impression Statement  Continued with focus on improving functional strength, body mechanics and core stab.  Added lunges and wall slides to improve stability.  Pt with improved core with completing quadruped actvities today, no LOB and minimal cues for reducing trunk movement.  Pt able to demonstrate correct lifting technique form floor to waist without cues to correct.  Continued to remain painfree at end of session.     Rehab Potential  Good    PT Frequency  2x / week    PT Duration  3 weeks    PT  Treatment/Interventions  ADLs/Self Care Home Management;Cryotherapy;Electrical Stimulation;Moist Heat;Ultrasound;DME Instruction;Gait training;Functional mobility training;Therapeutic activities;Stair training;Therapeutic exercise;Balance training;Neuromuscular re-education;Patient/family education;Manual techniques;Passive range of motion;Scar mobilization;Dry needling;Taping    PT Next Visit Plan  Continue manual to tight scar on Lt hip/lumbar region PRN. progress core, hip, and ankle strengthening as tolerated and within BLT lumbar precautions.    PT Home Exercise Plan  eval: supine ab set, seated heel and toe raises; 8/20: hip hinge with dowel; 9/5: ab set with march    Consulted and Agree with Plan of Care  Patient       Patient will benefit from skilled therapeutic intervention in order to improve the following deficits and impairments:  Abnormal gait, Decreased activity tolerance, Decreased balance, Decreased endurance, Decreased mobility, Decreased range of motion, Decreased strength, Difficulty walking, Hypomobility, Increased fascial restricitons, Increased muscle spasms, Impaired flexibility, Impaired sensation, Improper body mechanics, Postural dysfunction, Pain  Visit Diagnosis: Acute bilateral low back pain without sciatica  Muscle weakness (generalized)  Difficulty in walking, not elsewhere classified     Problem List Patient Active Problem List   Diagnosis Date Noted  . Special screening for malignant neoplasms, colon    Teena Irani, PTA/CLT [phone]  Teena Irani 12/15/2017, 12:04 PM  [place] [address] [place], [place], [address] Phone: [phone]   Fax:  [phone]  Name: Haylea Schlichting MRN: [id number] Date of Birth: 1966/03/11

## 2017-12-15 NOTE — Telephone Encounter (Signed)
Faxed request to extend PT with Re-eval and 2 sets of progress notes. Once the Medical Director states approved or not - Gabriela Rush will call me back with the decision. Call [employer] after that to get verifciation from ins with approval Ref#Number. NF 12/15/2017

## 2017-12-16 ENCOUNTER — Telehealth (HOSPITAL_COMMUNITY): Payer: Self-pay

## 2017-12-16 NOTE — Telephone Encounter (Signed)
Returned call to [facility] Case [facility]agers with Lorella Nimrod  [phone] NVB#[id number] NF 12/16/2017

## 2017-12-17 ENCOUNTER — Encounter (HOSPITAL_COMMUNITY): Payer: Self-pay

## 2017-12-17 ENCOUNTER — Ambulatory Visit (HOSPITAL_COMMUNITY): Payer: BLUE CROSS/BLUE SHIELD

## 2017-12-17 DIAGNOSIS — M545 Low back pain, unspecified: Secondary | ICD-10-CM

## 2017-12-17 DIAGNOSIS — R262 Difficulty in walking, not elsewhere classified: Secondary | ICD-10-CM

## 2017-12-17 DIAGNOSIS — M6281 Muscle weakness (generalized): Secondary | ICD-10-CM

## 2017-12-17 NOTE — Patient Instructions (Signed)
Hip Abduction: Modified    Lying on right side with pillow between thighs, raise top leg from pillow. Repeat 15__ times per set. Do _2___ sets per session. Each leg. Do _1___ sessions per day.  http://orth.exer.us/705   Copyright  VHI. All rights reserved.  Hip Extension: 2-4 Inches    Tighten gluteal muscle. Lift one leg _15__ times. Restabilize pelvis. Repeat with other leg. Keep pelvis still. Be sure pelvis does not rotate and back does not arch. Do _2__ sets, 1___ times per day.  http://ss.exer.us/63   Copyright  VHI. All rights reserved.

## 2017-12-18 NOTE — Therapy (Signed)
[place], [place], [address] Phone: [phone]   Fax:  [phone]  Physical Therapy Treatment  Patient Details  Name: Gabriela Rush MRN: [id number] Date of Birth: 1966-03-13 Referring Provider: Arlester Marker, APRN (Surgeon: Dr. Bridgett Larsson)   Encounter Date: 12/17/2017  PT End of Session - 12/17/17 1304    Visit Number  16    Number of Visits  32    Date for PT Re-Evaluation  12/24/17    Authorization Type  BCBS Other    Authorization Time Period  10/22/17 to 12/03/17; NEW: 9/5/19to 12/24/17 (seeking approval for more authorized visits from insurance)    Authorization - Visit Number  16    Authorization - Number of Visits  20    PT Start Time  1306    PT Stop Time  1346    PT Time Calculation (min)  40 min    Equipment Utilized During Treatment  Gait belt    Activity Tolerance  Patient tolerated treatment well;No increased pain    Behavior During Therapy  WFL for tasks assessed/performed       Past Medical History:  Diagnosis Date  . Diabetes mellitus without complication ([place])   . Family history of adverse reaction to anesthesia    Mother had problem with anesthesia; The "lost" her during a surgery; brought her back.  . Hypertension     Past Surgical History:  Procedure Laterality Date  . BREAST REDUCTION SURGERY    . CESAREAN SECTION  1988  . COLONOSCOPY N/A 10/20/2016   Procedure: COLONOSCOPY;  Surgeon: Danie Binder, MD;  [facility];  Service: Endoscopy;  Laterality: N/A;  1:00 pm    There were no vitals filed for this visit.  Subjective Assessment - 12/17/17 1304    Subjective  Reports a little sore today. More like muscle sore Lt > Rt low back.     Pain Score  2     Pain Location  Back    Pain Orientation  Left;Right    Pain Descriptors / Indicators  Sore                       [facility] Adult PT Treatment/Exercise - 12/18/17 0001      Lumbar Exercises: Standing   Heel  Raises  Limitations;20 reps    Heel Raises Limitations  Toe raises 2 x 10 repetitions    Forward Lunge  15 reps;Limitations    Forward Lunge Limitations  onto 4" step no UE's    Wall Slides  10 reps;Limitations    Wall Slides Limitations  5" holds     Other Standing Lumbar Exercises  proper lifting technique 10# x5 floor to waist and OH      Lumbar Exercises: Supine   Bridge with Ball Squeeze  15 reps;3 seconds    Bridge with [employer] Limitations  2 sets, to deter external rotation at hips      Lumbar Exercises: Sidelying   Hip Abduction  Both;15 reps;Limitations    Hip Abduction Limitations  2 sets      Lumbar Exercises: Prone   Straight Leg Raise  15 reps    Straight Leg Raises Limitations  2 sets             PT Education - 12/18/17 0959    Education Details  Advanced HEP, discussed purpose and technique of treatment throughout session.    Person(s) Educated  Patient  Methods  Explanation;Demonstration;Handout    Comprehension  Verbalized understanding;Returned demonstration       PT Short Term Goals - 12/17/17 1313      PT SHORT TERM GOAL #1   Title  Pt will be independent with HEP and perform consistently in order to decrease pain and maximize return to PLOF.    Time  4    Period  Weeks    Status  Achieved      PT SHORT TERM GOAL #2   Title  Pt will have improved BLE MMT by 1/2 grade throughout in order to maximize gait and balance.    Baseline  9/5: see MMT    Time  3    Period  Weeks    Status  Achieved      PT SHORT TERM GOAL #3   Title  Pt will be able to perform R SLS for 10 sec or > without Trendelenberg sign in order to demo improved functional glute med strength so as to maximize her gait and stair ambulation.    Baseline  9/5: 12.3sec, mild Trendelenberg    Time  3    Period  Weeks    Status  Partially Met      PT SHORT TERM GOAL #4   Title  Pt will be able to perform 12 STS during 30sec chair rise test in order to demo improved balance  and functional strength.     Baseline  8/20: 15x;     Time  3    Status  Achieved        PT Long Term Goals - 12/17/17 1317      PT LONG TERM GOAL #1   Title  Pt will have improved BLE MMT by 1 grade throughout in order to further maximize gait and balance and promote return to PLOF.    Baseline  9/5: see MMT    Time  6    Period  Weeks    Status  Partially Met      PT LONG TERM GOAL #2   Title  Pt will be able to perform bil SLS for 20 sec or > without Trendelenberg sign in order to demo further improved core, functional, and BLE strength so as to maximize her gait on uneven ground and stair ambulation.    Baseline  8/20: L: 25sec, R: 12 sec or <    Time  6    Period  Weeks    Status  Partially Met      PT LONG TERM GOAL #3   Title  Pt will have 149f improvement during 3MWT with gait mechanics WFL and without signs of R Trendelenberg sign to demo improved functional strength and overall functional mobility.    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    Baseline  9/5: can stand for 1 hour and then her pain increases    Time  6    Period  Weeks    Status  Achieved  Plan - 12/17/17 1304    Clinical Impression Statement  continued with focus on improving functional strength, body mechanics and core stabilization. Educated patient on side-lying abduction performed leading with heel with greatly increased the difficulty level for patient. Patient able to demonstrat correct lifting technique from floor to waise without cues to correct and overhead. No increase in pain at EOS.     Rehab Potential  Good     PT Frequency  2x / week    PT Duration  3 weeks    PT Treatment/Interventions  ADLs/Self Care Home Management;Cryotherapy;Electrical Stimulation;Moist Heat;Ultrasound;DME Instruction;Gait training;Functional mobility training;Therapeutic activities;Stair training;Therapeutic exercise;Balance training;Neuromuscular re-education;Patient/family education;Manual techniques;Passive range of motion;Scar mobilization;Dry needling;Taping    PT Next Visit Plan  re-assessment 12/24/17; Continue manual to tight scar on Lt hip/lumbar region PRN. progress core, hip, and ankle strengthening as tolerated and within BLT lumbar precautions.    PT Home Exercise Plan  eval: supine ab set, seated heel and toe raises; 8/20: hip hinge with dowel; 9/5: ab set with march; 12/17/17 - side-lying hip abd, prone hip extenstion    Consulted and Agree with Plan of Care  Patient       Patient will benefit from skilled therapeutic intervention in order to improve the following deficits and impairments:  Abnormal gait, Decreased activity tolerance, Decreased balance, Decreased endurance, Decreased mobility, Decreased range of motion, Decreased strength, Difficulty walking, Hypomobility, Increased fascial restricitons, Increased muscle spasms, Impaired flexibility, Impaired sensation, Improper body mechanics, Postural dysfunction, Pain  Visit Diagnosis: Acute bilateral low back pain without sciatica  Muscle weakness (generalized)  Difficulty in walking, not elsewhere classified     Problem List Patient Active Problem List   Diagnosis Date Noted  . Special screening for malignant neoplasms, colon     Floria Raveling. Hartnett-Rands, MS, PT [facility] #[id number] 12/18/2017, 10:07 AM  [facility] [address] [place], [place], [address] Phone: [phone]   Fax:  [phone]  Name: Gabriela Rush MRN: [id number] Date of Birth: 1966/02/13

## 2017-12-22 ENCOUNTER — Ambulatory Visit (HOSPITAL_COMMUNITY): Payer: BLUE CROSS/BLUE SHIELD

## 2017-12-22 ENCOUNTER — Encounter (HOSPITAL_COMMUNITY): Payer: Self-pay

## 2017-12-22 DIAGNOSIS — M545 Low back pain, unspecified: Secondary | ICD-10-CM

## 2017-12-22 DIAGNOSIS — M6281 Muscle weakness (generalized): Secondary | ICD-10-CM

## 2017-12-22 DIAGNOSIS — R262 Difficulty in walking, not elsewhere classified: Secondary | ICD-10-CM

## 2017-12-22 NOTE — Therapy (Signed)
[place], [place], [address] Phone: [phone]   Fax:  [phone]  Physical Therapy Treatment  Patient Details  Name: Gabriela Rush MRN: [id number] Date of Birth: 20-Oct-1965 Referring Provider: Arlester Marker, APRN (Surgeon: Dr. Bridgett Larsson)   Encounter Date: 12/22/2017  PT End of Session - 12/22/17 1258    Visit Number  17    Number of Visits  32    Date for PT Re-Evaluation  12/24/17    Authorization Type  BCBS Other    Authorization Time Period  10/22/17 to 12/03/17; NEW: 9/5/19to 12/24/17 (seeking approval for more authorized visits from insurance)    Authorization - Visit Number  17    Authorization - Number of Visits  20    PT Start Time  1300    PT Stop Time  1342    PT Time Calculation (min)  42 min    Equipment Utilized During Treatment  Gait belt    Activity Tolerance  Patient tolerated treatment well;No increased pain    Behavior During Therapy  WFL for tasks assessed/performed       Past Medical History:  Diagnosis Date  . Diabetes mellitus without complication ([place])   . Family history of adverse reaction to anesthesia    Mother had problem with anesthesia; The "lost" her during a surgery; brought her back.  . Hypertension     Past Surgical History:  Procedure Laterality Date  . BREAST REDUCTION SURGERY    . CESAREAN SECTION  1988  . COLONOSCOPY N/A 10/20/2016   Procedure: COLONOSCOPY;  Surgeon: Danie Binder, MD;  [facility];  Service: Endoscopy;  Laterality: N/A;  1:00 pm    There were no vitals filed for this visit.  Subjective Assessment - 12/22/17 1301    Subjective  Pt states that she feels a little stiff today. She returns to work on 12/29/17.    Currently in Pain?  No/denies             [facility] Adult PT Treatment/Exercise - 12/22/17 0001      Exercises   Exercises  Lumbar      Lumbar Exercises: Standing   Heel Raises  20 reps    Heel Raises Limitations  heel and toe     Functional Squats  20 reps    Functional Squats Limitations  cues for form to increase hip hinge    Lifting  From floor;10 reps    Lifting Weights (lbs)  15    Forward Lunge  10 reps    Forward Lunge Limitations  2 sets, cues for form    Shoulder Extension Limitations  bil tandem stance foam +palov press RTB x10 reps each    Shoulder Adduction Limitations  bil hip hikes 4" step 2x10reps each    Other Standing Lumbar Exercises  airplanes GTB 2x10 each    Other Standing Lumbar Exercises  RDLs 10# 2x10 reps            PT Education - 12/22/17 1259    Education Details  exercise technique, continue HEP    Person(s) Educated  Patient    Methods  Explanation;Demonstration    Comprehension  Verbalized understanding;Returned demonstration       PT Short Term Goals - 12/17/17 1313      PT SHORT TERM GOAL #1   Title  Pt will be independent with HEP and perform consistently in order to decrease pain and maximize return to PLOF.  Time  4    Period  Weeks    Status  Achieved      PT SHORT TERM GOAL #2   Title  Pt will have improved BLE MMT by 1/2 grade throughout in order to maximize gait and balance.    Baseline  9/5: see MMT    Time  3    Period  Weeks    Status  Achieved      PT SHORT TERM GOAL #3   Title  Pt will be able to perform R SLS for 10 sec or > without Trendelenberg sign in order to demo improved functional glute med strength so as to maximize her gait and stair ambulation.    Baseline  9/5: 12.3sec, mild Trendelenberg    Time  3    Period  Weeks    Status  Partially Met      PT SHORT TERM GOAL #4   Title  Pt will be able to perform 12 STS during 30sec chair rise test in order to demo improved balance and functional strength.     Baseline  8/20: 15x;     Time  3    Status  Achieved        PT Long Term Goals - 12/17/17 1317      PT LONG TERM GOAL #1   Title  Pt will have improved BLE MMT by 1 grade throughout in order to further maximize gait and  balance and promote return to PLOF.    Baseline  9/5: see MMT    Time  6    Period  Weeks    Status  Partially Met      PT LONG TERM GOAL #2   Title  Pt will be able to perform bil SLS for 20 sec or > without Trendelenberg sign in order to demo further improved core, functional, and BLE strength so as to maximize her gait on uneven ground and stair ambulation.    Baseline  8/20: L: 25sec, R: 12 sec or <    Time  6    Period  Weeks    Status  Partially Met      PT LONG TERM GOAL #3   Title  Pt will have 156f improvement during 3MWT with gait mechanics WFL and without signs of R Trendelenberg sign to demo improved functional strength and overall functional mobility.    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    Baseline  9/5: can stand for 1 hour and then her pain increases    Time  6    Period  Weeks    Status  Achieved            Plan - 12/22/17 1344    Clinical Impression Statement  Continued with established POC focusing on functional, BLE, and core strengthening. Min cues for form required throughout entire session. She continues to demo hip weakness AEB difficulty with strengthening and subjective reports of fatigue. Added RDLs this date for posterior chain  strengthening, hip hikes and airplanes for hip abd strengthening, and progressed her core strengthening exercises. Pt's lifting technique looked very good today. PT educated her to expect some soreness following today's session and she verbalized understanding. Pt due for reassessment next visit.    Rehab Potential  Good    PT Frequency  2x / week    PT  Duration  3 weeks    PT Treatment/Interventions  ADLs/Self Care Home Management;Cryotherapy;Electrical Stimulation;Moist Heat;Ultrasound;DME Instruction;Gait training;Functional mobility training;Therapeutic activities;Stair training;Therapeutic exercise;Balance training;Neuromuscular re-education;Patient/family education;Manual techniques;Passive range of motion;Scar mobilization;Dry needling;Taping    PT Next Visit Plan  re-assessment 12/24/17; Continue manual to tight scar on Lt hip/lumbar region PRN. progress core, hip, and ankle strengthening as tolerated and within BLT lumbar precautions.    PT Home Exercise Plan  eval: supine ab set, seated heel and toe raises; 8/20: hip hinge with dowel; 9/5: ab set with march; 12/17/17 - side-lying hip abd, prone hip extenstion; 9/24: sidestepping and airplanes wiht GTB    Consulted and Agree with Plan of Care  Patient       Patient will benefit from skilled therapeutic intervention in order to improve the following deficits and impairments:  Abnormal gait, Decreased activity tolerance, Decreased balance, Decreased endurance, Decreased mobility, Decreased range of motion, Decreased strength, Difficulty walking, Hypomobility, Increased fascial restricitons, Increased muscle spasms, Impaired flexibility, Impaired sensation, Improper body mechanics, Postural dysfunction, Pain  Visit Diagnosis: Acute bilateral low back pain without sciatica  Muscle weakness (generalized)  Difficulty in walking, not elsewhere classified     Problem List Patient Active Problem List   Diagnosis Date Noted  . Special screening for malignant neoplasms, colon         Geraldine Solar PT, DPT  [facility] [address] [place], [place], [address] Phone: [phone]   Fax:  [phone]  Name: Gabriela Rush MRN: [id number] Date of Birth: April 29, 1965

## 2017-12-24 ENCOUNTER — Encounter (HOSPITAL_COMMUNITY): Payer: Self-pay

## 2017-12-24 ENCOUNTER — Encounter

## 2017-12-24 ENCOUNTER — Ambulatory Visit (HOSPITAL_COMMUNITY): Payer: BLUE CROSS/BLUE SHIELD

## 2017-12-24 DIAGNOSIS — R262 Difficulty in walking, not elsewhere classified: Secondary | ICD-10-CM

## 2017-12-24 DIAGNOSIS — M6281 Muscle weakness (generalized): Secondary | ICD-10-CM

## 2017-12-24 DIAGNOSIS — M545 Low back pain, unspecified: Secondary | ICD-10-CM

## 2017-12-24 NOTE — Therapy (Signed)
[place], [place], [address] Phone: [phone]   Fax:  [phone]   Progress Note Reporting Period 12/03/17 to 12/24/17  See note below for Objective Data and Assessment of Progress/Goals.    Physical Therapy Treatment  Patient Details  Name: Gabriela Rush MRN: [id number] Date of Birth: [birth year] Referring Provider (PT): Arlester Marker, APRN (Surgeon: Dr. Bridgett Larsson)   Encounter Date: 12/24/2017  PT End of Session - 12/24/17 1259    Visit Number  18    Number of Visits  26    Date for PT Re-Evaluation  01/14/18    Authorization Type  BC[employer] Other    Authorization Time Period  10/22/17 to 12/03/17; NEW: 9/5/19to 12/24/17; NEW: 12/24/17 to 01/14/18    Authorization - Visit Number  18    Authorization - Number of Visits  32   approved for 32 total visits   PT Start Time  1300    PT Stop Time  1340    PT Time Calculation (min)  40 min    Equipment Utilized During Treatment  Gait belt    Activity Tolerance  Patient tolerated treatment well;No increased pain    Behavior During Therapy  WFL for tasks assessed/performed       Past Medical History:  Diagnosis Date  . Diabetes mellitus without complication ([place])   . Family history of adverse reaction to anesthesia    Mother had problem with anesthesia; The "lost" her during a surgery; brought her back.  . Hypertension     Past Surgical History:  Procedure Laterality Date  . BREAST REDUCTION SURGERY    . CESAREAN SECTION  1988  . COLONOSCOPY N/A 10/20/2016   Procedure: COLONOSCOPY;  Surgeon: Danie Binder, MD;  [facility];  Service: Endoscopy;  Laterality: N/A;  1:00 pm    There were no vitals filed for this visit.  Subjective Assessment - 12/24/17 1300    Subjective  Pt reports that she is sore following her last session but no pain.     Currently in Pain?  No/denies         [facility] PT Assessment - 12/24/17 0001      Assessment   Medical Diagnosis   Back pain    Referring Provider (PT)  Arlester Marker, APRN   Surgeon: Dr. Bridgett Larsson   Onset Date/Surgical Date  09/09/17    Next MD Visit  nothing scheduled yet    Prior Therapy  none      Strength   Right Hip Extension  4/5   was 4   Right Hip ABduction  4/5   was 4-   Left Hip Extension  4+/5   was 4+   Left Hip ABduction  4+/5   was 4+   Right Ankle Dorsiflexion  4-/5   was 4-     Ambulation/Gait   Ambulation Distance (Feet)  804 Feet   3MWT; was 790f   Assistive device  None    Gait Pattern  Step-through pattern;Decreased dorsiflexion - right;Trendelenburg      Balance   Balance Assessed  Yes      Static Standing Balance   Static Standing - Balance Support  No upper extremity supported    Static Standing Balance -  Activities   Single Leg Stance - Right Leg    Static Standing - Comment/# of Minutes  R: 7.5sec or <, minimal Trendelenberg    was 12.3sec on R with Tberg during last  reassessment             [facility] Adult PT Treatment/Exercise - 12/24/17 0001      Exercises   Exercises  Lumbar      Lumbar Exercises: Standing   Row Limitations  farmer's carries with 10# x1 lap holding in each UE      Lumbar Exercises: Seated   Hip Flexion on Ball  Both;20 reps    Other Seated Lumbar Exercises  single leg balance on medium physioball +EC 3x10" holds each      Lumbar Exercises: Supine   Bridge  10 reps    Bridge Limitations  2 sets, medium physioball      Lumbar Exercises: Prone   Other Prone Lumbar Exercises  physioball rollouts on knees 2x10 reps (multimodal cueing for form)             PT Education - 12/24/17 1300    Education Details  reassessment findings    Person(s) Educated  Patient    Methods  Explanation;Demonstration    Comprehension  Verbalized understanding;Returned demonstration       PT Short Term Goals - 12/24/17 1302      PT SHORT TERM GOAL #1   Title  Pt will be independent with HEP and perform consistently in order to decrease  pain and maximize return to PLOF.    Time  4    Period  Weeks    Status  Achieved      PT SHORT TERM GOAL #2   Title  Pt will have improved BLE MMT by 1/2 grade throughout in order to maximize gait and balance.    Baseline  9/5: see MMT    Time  3    Period  Weeks    Status  Achieved      PT SHORT TERM GOAL #3   Title  Pt will be able to perform R SLS for 10 sec or > without Trendelenberg sign in order to demo improved functional glute med strength so as to maximize her gait and stair ambulation.    Baseline  9/5: 12.3sec, mild Trendelenberg    Time  3    Period  Weeks    Status  Partially Met      PT SHORT TERM GOAL #4   Title  Pt will be able to perform 12 STS during 30sec chair rise test in order to demo improved balance and functional strength.     Baseline  8/20: 15x;     Time  3    Status  Achieved      PT SHORT TERM GOAL #5   Title  Pt will be able to lift 30# from floor to chest 5x wiht proper form and without LBP to demo improved core and fucntional strength so as to maximize her work duties and decrease risk for reinjury.    Time  3    Period  Weeks    Status  New    Target Date  01/14/18        PT Long Term Goals - 12/24/17 1302      PT LONG TERM GOAL #1   Title  Pt will have improved BLE MMT by 1 grade throughout in order to further maximize gait and balance and promote return to PLOF.    Baseline  9/5: see MMT    Time  6    Period  Weeks    Status  Partially Met      PT LONG  TERM GOAL #2   Title  Pt will be able to perform bil SLS for 20 sec or > without Trendelenberg sign in order to demo further improved core, functional, and BLE strength so as to maximize her gait on uneven ground and stair ambulation.    Baseline  8/20: L: 25sec, R: 12 sec or <    Time  6    Period  Weeks    Status  Partially Met      PT LONG TERM GOAL #3   Title  Pt will have 18f improvement during 3MWT with gait mechanics WFL and without signs of R Trendelenberg sign to demo  improved functional strength and overall functional mobility.    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    Baseline  9/5: can stand for 1 hour and then her pain increases    Time  6    Period  Weeks    Status  Achieved            Plan - 12/24/17 1341    Clinical Impression Statement  PT reassessed pt's goals and outcome measures this date. Pt has made good progress overall as illustrated above. Her R hip abd MMT slightly improved from her last reassessment while all other mm remained the same, her R SLS is still challenging to her, and her overall tolerance to functional tasks has improved. Pt's functional gait is continuing to normalize as her Trendelenberg sign was not present until she was about 2min81mnto the 3MWT, which indicates continued functional weakness and deficits in endurance. Pt continues with lifting deficits, core strength, hip strength (R>L), and functional strength and need continued skilled PT intervention to further address these impairments in order to maximize pt's return to work and decrease risk for reinjury. PT recommending 2x/week for 3 more weeks and updated goals to include lifting mechanics.     Rehab Potential  Good    PT Frequency  2x / week    PT Duration  3 weeks    PT Treatment/Interventions  ADLs/Self Care Home Management;Cryotherapy;Electrical Stimulation;Moist Heat;Ultrasound;DME Instruction;Gait training;Functional mobility training;Therapeutic activities;Stair training;Therapeutic exercise;Balance training;Neuromuscular re-education;Patient/family education;Manual techniques;Passive  range of motion;Scar mobilization;Dry needling;Taping    PT Next Visit Plan  re-assessment 12/24/17; Continue manual to tight scar on Lt hip/lumbar region PRN. progress core, hip, and ankle strengthening as tolerated and within BLT lumbar precautions.    PT Home Exercise Plan  eval: supine ab set, seated heel and toe raises; 8/20: hip hinge with dowel; 9/5: ab set with march; 12/17/17 - side-lying hip abd, prone hip extenstion; 9/24: sidestepping and airplanes wiht GTB    Consulted and Agree with Plan of Care  Patient       Patient will benefit from skilled therapeutic intervention in order to improve the following deficits and impairments:  Abnormal gait, Decreased activity tolerance, Decreased balance, Decreased endurance, Decreased mobility, Decreased range of motion, Decreased strength, Difficulty walking, Hypomobility, Increased fascial restricitons, Increased muscle spasms, Impaired flexibility, Impaired sensation, Improper body mechanics,  Postural dysfunction, Pain  Visit Diagnosis: Acute bilateral low back pain without sciatica - Plan: PT plan of care cert/re-cert  Muscle weakness (generalized) - Plan: PT plan of care cert/re-cert  Difficulty in walking, not elsewhere classified - Plan: PT plan of care cert/re-cert     Problem List Patient Active Problem List   Diagnosis Date Noted  . Special screening for malignant neoplasms, colon        Geraldine Solar PT, DPT  [facility] [address] [place], [place], [address] Phone: [phone]   Fax:  [phone]  Name: Gabriela Rush MRN: [id number] Date of Birth: 12/23/1965

## 2017-12-29 ENCOUNTER — Telehealth (HOSPITAL_COMMUNITY): Payer: Self-pay | Admitting: Family Medicine

## 2017-12-29 ENCOUNTER — Ambulatory Visit (HOSPITAL_COMMUNITY): Payer: BLUE CROSS/BLUE SHIELD

## 2017-12-29 NOTE — Telephone Encounter (Signed)
12/29/17  pt called to cx said she had to be at a funeral at 37

## 2018-01-01 ENCOUNTER — Encounter (HOSPITAL_COMMUNITY): Payer: Self-pay

## 2018-01-01 ENCOUNTER — Ambulatory Visit (HOSPITAL_COMMUNITY): Payer: BLUE CROSS/BLUE SHIELD | Attending: Nurse Practitioner

## 2018-01-01 DIAGNOSIS — R262 Difficulty in walking, not elsewhere classified: Secondary | ICD-10-CM

## 2018-01-01 DIAGNOSIS — M6281 Muscle weakness (generalized): Secondary | ICD-10-CM | POA: Diagnosis present

## 2018-01-01 DIAGNOSIS — M545 Low back pain, unspecified: Secondary | ICD-10-CM

## 2018-01-01 NOTE — Therapy (Signed)
[place], [place], [address] Phone: [phone]   Fax:  [phone]  Physical Therapy Treatment  Patient Details  Name: Gabriela Rush MRN: [id number] Date of Birth: May 25, 1965 Referring Provider (PT): Arlester Marker, APRN (Surgeon: Dr. Bridgett Larsson)   Encounter Date: 01/01/2018  PT End of Session - 01/01/18 1119    Visit Number  19    Number of Visits  26    Date for PT Re-Evaluation  01/14/18    Authorization Type  BCBS Other    Authorization Time Period  10/22/17 to 12/03/17; NEW: 9/5/19to 12/24/17; NEW: 12/24/17 to 01/14/18    Authorization - Visit Number  19    Authorization - Number of Visits  32   approved for 32 total visits   PT Start Time  1117    PT Stop Time  1158    PT Time Calculation (min)  41 min    Equipment Utilized During Treatment  Gait belt    Activity Tolerance  Patient tolerated treatment well;No increased pain    Behavior During Therapy  WFL for tasks assessed/performed       Past Medical History:  Diagnosis Date  . Diabetes mellitus without complication ([place])   . Family history of adverse reaction to anesthesia    Mother had problem with anesthesia; The "lost" her during a surgery; brought her back.  . Hypertension     Past Surgical History:  Procedure Laterality Date  . BREAST REDUCTION SURGERY    . CESAREAN SECTION  1988  . COLONOSCOPY N/A 10/20/2016   Procedure: COLONOSCOPY;  Surgeon: Danie Binder, MD;  [facility];  Service: Endoscopy;  Laterality: N/A;  1:00 pm    There were no vitals filed for this visit.  Subjective Assessment - 01/01/18 1117    Subjective  Very tired having worked Tu, Wed, Th nights. Hasn't had pain, just fatigue.                       [facility] Adult PT Treatment/Exercise - 01/01/18 0001      Lumbar Exercises: Standing   Heel Raises  20 reps    Heel Raises Limitations  heel and toe    Lifting  From floor;10 reps    Lifting Weights  (lbs)  20    Forward Lunge  10 reps;Limitations    Forward Lunge Limitations  split squat lifting from floor to 18 inch step and back down    Row Limitations  farmer's carry with 10# weight in each UE and 2# on one wrist; alternate wrist 1/2 way through lap    Other Standing Lumbar Exercises  RDLs 10# 2x10 reps      Lumbar Exercises: Seated   Hip Flexion on Diona Foley  Both;20 reps             PT Education - 01/01/18 1201    Education Details  Discussed purpose and technique of interventions throughout session. Bdy mechanics.    Person(s) Educated  Patient    Methods  Explanation;Demonstration    Comprehension  Verbalized understanding       PT Short Term Goals - 12/24/17 1302      PT SHORT TERM GOAL #1   Title  Pt will be independent with HEP and perform consistently in order to decrease pain and maximize return to PLOF.    Time  4    Period  Weeks    Status  Achieved  PT SHORT TERM GOAL #2   Title  Pt will have improved BLE MMT by 1/2 grade throughout in order to maximize gait and balance.    Baseline  9/5: see MMT    Time  3    Period  Weeks    Status  Achieved      PT SHORT TERM GOAL #3   Title  Pt will be able to perform R SLS for 10 sec or > without Trendelenberg sign in order to demo improved functional glute med strength so as to maximize her gait and stair ambulation.    Baseline  9/5: 12.3sec, mild Trendelenberg    Time  3    Period  Weeks    Status  Partially Met      PT SHORT TERM GOAL #4   Title  Pt will be able to perform 12 STS during 30sec chair rise test in order to demo improved balance and functional strength.     Baseline  8/20: 15x;     Time  3    Status  Achieved      PT SHORT TERM GOAL #5   Title  Pt will be able to lift 30# from floor to chest 5x wiht proper form and without LBP to demo improved core and fucntional strength so as to maximize her work duties and decrease risk for reinjury.    Time  3    Period  Weeks    Status  New     Target Date  01/14/18        PT Long Term Goals - 12/24/17 1302      PT LONG TERM GOAL #1   Title  Pt will have improved BLE MMT by 1 grade throughout in order to further maximize gait and balance and promote return to PLOF.    Baseline  9/5: see MMT    Time  6    Period  Weeks    Status  Partially Met      PT LONG TERM GOAL #2   Title  Pt will be able to perform bil SLS for 20 sec or > without Trendelenberg sign in order to demo further improved core, functional, and BLE strength so as to maximize her gait on uneven ground and stair ambulation.    Baseline  8/20: L: 25sec, R: 12 sec or <    Time  6    Period  Weeks    Status  Partially Met      PT LONG TERM GOAL #3   Title  Pt will have 126f improvement during 3MWT with gait mechanics WFL and without signs of R Trendelenberg sign to demo improved functional strength and overall functional mobility.    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    Baseline  9/5: can stand for 1 hour and then her pain increases    Time  6    Period  Weeks    Status  Achieved  Plan - 01/01/18 1119    Clinical Impression Statement  Continued with established POC focusing on body mechanics, functional BLE and core strengthening. Min cues for from required throughout session. Patient complained of fatigue throughout session but not pain. Lifting technique through squat and split squat/half-kneeling was good today. Continue with current plan, progressing as able. Titer ther ex in clinic next session due to patient working 6 night shifts in a row by then.     Rehab Potential   Good    PT Frequency  2x / week    PT Duration  3 weeks    PT Treatment/Interventions  ADLs/Self Care Home Management;Cryotherapy;Electrical Stimulation;Moist Heat;Ultrasound;DME Instruction;Gait training;Functional mobility training;Therapeutic activities;Stair training;Therapeutic exercise;Balance training;Neuromuscular re-education;Patient/family education;Manual techniques;Passive range of motion;Scar mobilization;Dry needling;Taping    PT Next Visit Plan  Continue manual to tight scar on lt hip/lumbar region PRN. progress core, hip, and ankle strengthening as tolerated and without BLT lumbar precautions.    PT Home Exercise Plan  eval: supine ab set, seated heel and toe raises; 8/20: hip hinge with dowel; 9/5: ab set with march; 12/17/17 - side-lying hip abd, prone hip extenstion; 9/24: sidestepping and airplanes wiht GTB    Consulted and Agree with Plan of Care  Patient       Patient will benefit from skilled therapeutic intervention in order to improve the following deficits and impairments:  Abnormal gait, Decreased activity tolerance, Decreased balance, Decreased endurance, Decreased mobility, Decreased range of motion, Decreased strength, Difficulty walking, Hypomobility, Increased fascial restricitons, Increased muscle spasms, Impaired flexibility, Impaired sensation, Improper body mechanics, Postural dysfunction, Pain  Visit Diagnosis: Acute bilateral low back pain without sciatica  Muscle weakness (generalized)  Difficulty in walking, not elsewhere classified     Problem List Patient Active Problem List   Diagnosis Date Noted  . Special screening for malignant neoplasms, colon     Floria Raveling. Hartnett-Rands, MS, PT [facility] #[id number] 01/01/2018, 12:09 PM  [facility] [address] [place], [place], [address] Phone: [phone]   Fax:  [phone]  Name: Gabriela Rush MRN: [id number] Date of Birth:  1965/06/03

## 2018-01-04 ENCOUNTER — Telehealth (HOSPITAL_COMMUNITY): Payer: Self-pay | Admitting: Family Medicine

## 2018-01-04 ENCOUNTER — Ambulatory Visit (HOSPITAL_COMMUNITY): Payer: BLUE CROSS/BLUE SHIELD | Admitting: Physical Therapy

## 2018-01-04 NOTE — Telephone Encounter (Signed)
01/04/18  pt left a messge to cancel - no reason given

## 2018-01-06 ENCOUNTER — Ambulatory Visit (HOSPITAL_COMMUNITY): Payer: BLUE CROSS/BLUE SHIELD | Admitting: Physical Therapy

## 2018-01-06 ENCOUNTER — Telehealth (HOSPITAL_COMMUNITY): Payer: Self-pay | Admitting: Family Medicine

## 2018-01-06 NOTE — Telephone Encounter (Signed)
01/06/18  pt left a message to cx said that she has been sick

## 2018-01-12 ENCOUNTER — Ambulatory Visit (HOSPITAL_COMMUNITY): Payer: BLUE CROSS/BLUE SHIELD | Admitting: Physical Therapy

## 2018-01-12 DIAGNOSIS — M6281 Muscle weakness (generalized): Secondary | ICD-10-CM

## 2018-01-12 DIAGNOSIS — M545 Low back pain, unspecified: Secondary | ICD-10-CM

## 2018-01-12 DIAGNOSIS — R262 Difficulty in walking, not elsewhere classified: Secondary | ICD-10-CM

## 2018-01-12 NOTE — Therapy (Signed)
[facility] [address] [place], [place], [address] Phone: [phone]   Fax:  [phone]   PHYSICAL THERAPY DISCHARGE SUMMARY  Visits from Start of Care: 20  Current functional level related to goals / functional outcomes: See below   Remaining deficits: See below   Education / Equipment: See below  Plan: Patient agrees to discharge.  Patient goals were met. Patient is being discharged due to meeting the stated rehab goals.  ?????     This PT spoke with Gabriela Rush, PTA, prior to and during Gabriela Rush's appointment and agree that she is ready for d/c at this time due to goals met and progress made.   Gabriela Rush PT, DPT    Physical Therapy Treatment  Patient Details  Name: Gabriela Rush MRN: [id number] Date of Birth: Jul 25, 1965 Referring Provider (PT): Gabriela Marker, APRN   Encounter Date: 01/12/2018  PT End of Session - 01/12/18 1206    Visit Number  20    Number of Visits  26    Date for PT Re-Evaluation  01/14/18    Authorization Type  [employer] Other    Authorization Time Period  10/22/17 to 12/03/17; NEW: 9/5/19to 12/24/17; NEW: 12/24/17 to 01/14/18    Authorization - Visit Number  20    Authorization - Number of Visits  32   approved for 32 total visits   PT Start Time  1120    PT Stop Time  1150    PT Time Calculation (min)  30 min    Equipment Utilized During Treatment  Gait belt    Activity Tolerance  Patient tolerated treatment well;No increased pain    Behavior During Therapy  WFL for tasks assessed/performed       Past Medical History:  Diagnosis Date  . Diabetes mellitus without complication ([place])   . Family history of adverse reaction to anesthesia    Mother had problem with anesthesia; The "lost" her during a surgery; brought her back.  . Hypertension     Past Surgical History:  Procedure Laterality Date  . BREAST REDUCTION SURGERY    . CESAREAN SECTION  1988  . COLONOSCOPY N/A 10/20/2016    Procedure: COLONOSCOPY;  Surgeon: Gabriela Binder, MD;  [facility];  Service: Endoscopy;  Laterality: N/A;  1:00 pm    There were no vitals filed for this visit.  Subjective Assessment - 01/12/18 1126    Subjective  PT states no pain and hasn't had any for a while.  States she is ready to be done with therapy.     Currently in Pain?  No/denies         [facility] PT Assessment - 01/12/18 0001      Assessment   Medical Diagnosis  Back pain    Referring Provider (PT)  Gabriela Marker, APRN    Onset Date/Surgical Date  09/09/17      Precautions   Precautions  --      Observation/Other Assessments   Focus on Therapeutic Outcomes (FOTO)   165 limitation (was 58% limitation)      Strength   Right Hip Extension  5/5   was 4-/5   Right Hip ABduction  4+/5   was 3+/5   Left Hip Extension  5/5   was 4/5   Left Hip ABduction  5/5   was 4/5   Right Ankle Dorsiflexion  4+/5   was 3+/5     Ambulation/Gait   Ambulation Distance (Feet)  Gabriela Rush was 730 feet with trendelenburg   Assistive device  None    Gait Pattern  Step-through pattern;Decreased dorsiflexion - right;Trendelenburg      Static Standing Balance   Static Standing - Balance Support  No upper extremity supported    Static Standing Balance -  Activities   Single Leg Stance - Right Leg    Static Standing - Comment/# of Minutes  rt: max 8" lt: max 20"                             PT Short Term Goals - 01/12/18 1148      PT SHORT TERM GOAL #1   Title  Pt will be independent with HEP and perform consistently in order to decrease pain and maximize return to PLOF.    Time  4    Period  Weeks    Status  Achieved      PT SHORT TERM GOAL #2   Title  Pt will have improved BLE MMT by 1/2 grade throughout in order to maximize gait and balance.    Baseline  9/5: see MMT    Time  3    Period  Weeks    Status  Achieved      PT SHORT TERM GOAL #3   Title  Pt will be able to perform R SLS  for 10 sec or > without Trendelenberg sign in order to demo improved functional glute med strength so as to maximize her gait and stair ambulation.    Baseline  9/5: 12.3sec, mild Trendelenberg    Time  3    Period  Weeks    Status  Achieved      PT SHORT TERM GOAL #4   Title  Pt will be able to perform 12 STS during 30sec chair rise test in order to demo improved balance and functional strength.     Baseline  8/20: 15x;     Time  3    Status  Achieved      PT SHORT TERM GOAL #5   Title  Pt will be able to lift 30# from floor to chest 5x wiht proper form and without LBP to demo improved core and fucntional strength so as to maximize her work duties and decrease risk for reinjury.    Time  3    Period  Weeks    Status  Achieved        PT Long Term Goals - 01/12/18 1148      PT LONG TERM GOAL #1   Title  Pt will have improved BLE MMT by 1 grade throughout in order to further maximize gait and balance and promote return to PLOF.    Baseline  9/5: see MMT    Time  6    Period  Weeks    Status  Achieved      PT LONG TERM GOAL #2   Title  Pt will be able to perform bil SLS for 20 sec or > without Trendelenberg sign in order to demo further improved core, functional, and BLE strength so as to maximize her gait on uneven ground and stair ambulation.    Baseline  8/20: L: 25sec, R: 12 sec or  10/15: only able to achieve with Lt LE    Time  6    Period  Weeks    Status  Partially Met  PT LONG TERM GOAL #3   Title  Pt will have 152f improvement during 3MWT with gait mechanics WFL and without signs of R Trendelenberg sign to demo improved functional strength and overall functional mobility.    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    Baseline  9/5: can stand for 1 hour and then her pain increases    Time  6    Period  Weeks    Status  Achieved            Plan - 01/12/18 1207    Clinical Impression Statement  Pt returns today verbalizing readiness to be discharged from therapy.  completed MMT with improved strength with slight weakness still remaining in Rt hip abductor and dorsiflexor.  Static balance is still limited as well, however overall improvement noted in all other areas.  Pt is able to lift 30# from floor  to waist 5X in good form and no pain.  Pt is working full time without issues at this point.  All STG's met and all but 1 met for LTG's.  Pt only demonstrates trendelenburg with fatigue.  Pt ready for discharge with no further skilled needs at this time.     Rehab Potential  Good    PT Frequency  2x / week    PT Duration  3 weeks    PT Treatment/Interventions  ADLs/Self Care Home Management;Cryotherapy;Electrical Stimulation;Moist Heat;Ultrasound;DME Instruction;Gait training;Functional mobility training;Therapeutic activities;Stair training;Therapeutic exercise;Balance training;Neuromuscular re-education;Patient/family education;Manual techniques;Passive range of motion;Scar mobilization;Dry needling;Taping    PT Next Visit Plan  Discharge per goals met and pateint agreement.      PT Home Exercise Plan  eval: supine ab set, seated heel and toe raises; 8/20: hip hinge with dowel; 9/5: ab set with march; 12/17/17 - side-lying hip abd, prone hip extenstion; 9/24: sidestepping and airplanes wiht GTB    Consulted and Agree with Plan of Care  Patient       Patient will benefit from skilled therapeutic intervention in order to improve the following deficits and impairments:  Abnormal gait, Decreased activity tolerance, Decreased balance, Decreased endurance, Decreased mobility, Decreased range of  motion, Decreased strength, Difficulty walking, Hypomobility, Increased fascial restricitons, Increased muscle spasms, Impaired flexibility, Impaired sensation, Improper body mechanics, Postural dysfunction, Pain  Visit Diagnosis: Acute bilateral low back pain without sciatica  Difficulty in walking, not elsewhere classified  Muscle weakness (generalized)     Problem List Patient Active Problem List   Diagnosis Date Noted  . Special screening for malignant neoplasms, colon    Amy Teena IraniA/CLT [phone] Irani15/2019, 12:14 PM  [facility] [address], [place]ne: [phone]:  [phone]: LisaMarlen Koman: [phone] of Birth: [birth year]

## 2018-01-15 ENCOUNTER — Ambulatory Visit (HOSPITAL_COMMUNITY): Payer: BLUE CROSS/BLUE SHIELD

## 2018-03-17 ENCOUNTER — Ambulatory Visit (HOSPITAL_COMMUNITY)
Admission: RE | Admit: 2018-03-17 | Discharge: 2018-03-17 | Disposition: A | Discharge status: Home / Self Care | Payer: BLUE CROSS/BLUE SHIELD | Source: Ambulatory Visit | Attending: Neurosurgery | Admitting: Neurosurgery

## 2018-03-17 ENCOUNTER — Other Ambulatory Visit (HOSPITAL_COMMUNITY): Payer: Self-pay | Admitting: Neurosurgery

## 2018-03-17 DIAGNOSIS — Z981 Arthrodesis status: Secondary | ICD-10-CM

## 2019-09-10 ENCOUNTER — Other Ambulatory Visit: Payer: Self-pay

## 2019-09-10 ENCOUNTER — Ambulatory Visit: Payer: BC Managed Care – PPO

## 2019-09-10 ENCOUNTER — Ambulatory Visit
Admission: EM | Admit: 2019-09-10 | Discharge: 2019-09-10 | Disposition: A | Discharge status: Home / Self Care | Payer: BC Managed Care – PPO

## 2019-09-10 ENCOUNTER — Ambulatory Visit (INDEPENDENT_AMBULATORY_CARE_PROVIDER_SITE_OTHER): Payer: BC Managed Care – PPO

## 2019-09-10 DIAGNOSIS — M25571 Pain in right ankle and joints of right foot: Secondary | ICD-10-CM

## 2019-09-10 DIAGNOSIS — M25471 Effusion, right ankle: Secondary | ICD-10-CM

## 2019-09-10 DIAGNOSIS — M7989 Other specified soft tissue disorders: Secondary | ICD-10-CM | POA: Diagnosis not present

## 2019-09-10 MED ORDER — PREDNISONE 20 MG PO TABS: 20.0000 mg | ORAL_TABLET | Freq: Two times a day (BID) | ORAL | 0 refills | Status: AC

## 2019-09-10 NOTE — Discharge Instructions (Signed)
X-ray negative for fracture Continue conservative management of rest, ice, and elevation Prednisone prescribed.   Take as directed and to completion Ankle brace given in office Follow up with foot specialist for further evaluation and management Return or go to the ER if you have any new or worsening symptoms (fever, chills, chest pain, redness, swelling, bruising, worsening symptoms despite treatment, etc...)

## 2019-09-10 NOTE — ED Triage Notes (Signed)
Pt presents with pain and swelling to right ankle. Denies injury . Swelling noted on lateral aspect of ankle.

## 2019-09-10 NOTE — ED Provider Notes (Signed)
[facility]   [id number] 09/10/19 Arrival Time: 8502  CC: RT ankle pain  SUBJECTIVE: History from: patient. Gabriela Rush is a 54 y.o. female complains of RT ankle pain and swelling x 1 day.  Denies a precipitating event or specific injury.  Localizes the pain to the outside and front of ankle.  Describes the pain as intermittent and sharp in character.  Has tried soaking in epsom salt baths without relief.  Symptoms are made worse with weight-bearing.  Reports similar symptoms in the past and told it was arthritis.  Prescribed meloxicam without relief.  Also tried orthotics for shoes.  Complains of swelling and numbness (secondary to back surgery). Denies fever, chills, erythema, ecchymosis, weakness.   ROS: As per HPI.  All other pertinent ROS negative.     Past Medical History:  Diagnosis Date  . Diabetes mellitus without complication ([place])   . Family history of adverse reaction to anesthesia    Mother had problem with anesthesia; The "lost" her during a surgery; brought her back.  . Hypertension    Past Surgical History:  Procedure Laterality Date  . BREAST REDUCTION SURGERY    . CESAREAN SECTION  1988  . COLONOSCOPY N/A 10/20/2016   Procedure: COLONOSCOPY;  Surgeon: Danie Binder, MD;  [facility];  Service: Endoscopy;  Laterality: N/A;  1:00 pm   No Known Allergies No current facility-administered medications on file prior to encounter.   Current Outpatient Medications on File Prior to Encounter  Medication Sig Dispense Refill  . atorvastatin (LIPITOR) 10 MG tablet Take by mouth.    . diphenhydramine-acetaminophen (TYLENOL PM) 25-500 MG TABS tablet Take 1 tablet by mouth at bedtime as needed (sleep).    . fluticasone (FLONASE) 50 MCG/ACT nasal spray Place 1 spray into both nostrils daily as needed for allergies or rhinitis.    . hydrochlorothiazide (HYDRODIURIL) 25 MG tablet Take 25 mg by mouth daily.    . metFORMIN (GLUCOPHAGE-XR) 500 MG 24 hr tablet  Take 500 mg by mouth daily with breakfast.     . naproxen sodium (ALEVE) 220 MG tablet Take 440 mg by mouth daily as needed (pain).      Social History   Socioeconomic History  . Marital status: Widowed    Spouse name: Not on file  . Number of children: Not on file  . Years of education: Not on file  . Highest education level: Not on file  Occupational History  . Not on file  Tobacco Use  . Smoking status: Never Smoker  . Smokeless tobacco: Never Used  Vaping Use  . Vaping Use: Never used  Substance and Sexual Activity  . Alcohol use: Yes    Alcohol/week: 0.0 standard drinks    Comment: Occasionally  . Drug use: No  . Sexual activity: Not Currently  Other Topics Concern  . Not on file  Social History Narrative  . Not on file   Social Determinants of Health   Financial Resource Strain:   . Difficulty of Paying Living Expenses:   Food Insecurity:   . Worried About Charity fundraiser in the Last Year:   . Arboriculturist in the Last Year:   Transportation Needs:   . Film/video editor (Medical):   Marland Kitchen Lack of Transportation (Non-Medical):   Physical Activity:   . Days of Exercise per Week:   . Minutes of Exercise per Session:   Stress:   . Feeling of Stress :   Social  Connections:   . Frequency of Communication with Friends and Family:   . Frequency of Social Gatherings with Friends and Family:   . Attends Religious Services:   . Active Member of Clubs or Organizations:   . Attends Archivist Meetings:   Marland Kitchen Marital Status:   Intimate Partner Violence:   . Fear of Current or Ex-Partner:   . Emotionally Abused:   Marland Kitchen Physically Abused:   . Sexually Abused:    Family History  Problem Relation Age of Onset  . Hypertension Mother   . Stroke Mother   . Kidney failure Mother   . Hypertension Father   . Diabetes Father   . Hypertension Sister   . Diabetes Sister   . Hypertension Sister   . Diabetes Sister     OBJECTIVE:  Vitals:   09/10/19 0832    BP: (!) 175/92  Pulse: 90  Resp: 18  Temp: 98.3 F (36.8 C)  SpO2: 97%    General appearance: ALERT; in no acute distress.  Head: NCAT Lungs: Normal respiratory effort CV: Dorsalis pedis pulses 2+ . Cap refill < 2 seconds Musculoskeletal: RT ankle Inspection: Effusion over lateral ankle Palpation: TTP over lateral and anterior ankle ROM: FROM active and passive Strength: deferred Skin: warm and dry Neurologic: Ambulates without difficulty; Sensation intact about the upper/ lower extremities Psychological: alert and cooperative; normal mood and affect  DIAGNOSTIC STUDIES:  DG Ankle Complete Right  Result Date: 09/10/2019 CLINICAL DATA:  Pain and swelling EXAM: RIGHT ANKLE - COMPLETE 3+ VIEW COMPARISON:  None. FINDINGS: Frontal, oblique, and lateral views were obtained. No evident fracture or joint effusion. No joint space narrowing or erosion. Ankle mortise appears intact. There is a small posterior calcaneal spur. IMPRESSION: No evident fracture or arthropathy. Small posterior calcaneal spur. Ankle mortise appears intact. Electronically Signed   By: Lowella Grip III M.D.   On: 09/10/2019 09:11    X-rays negative for bony abnormalities including fracture, or dislocation.  I have reviewed the x-rays myself and the radiologist interpretation. I am in agreement with the radiologist interpretation.     ASSESSMENT & PLAN:  1. Acute right ankle pain   2. Right ankle swelling     @NIMG @  Meds ordered this encounter  Medications  . predniSONE (DELTASONE) 20 MG tablet    Sig: Take 1 tablet (20 mg total) by mouth 2 (two) times daily with a meal for 5 days.    Dispense:  10 tablet    Refill:  0    Order Specific Question:   Supervising Provider    Answer:   Raylene Everts [[id number]]   X-ray negative for fracture Continue conservative management of rest, ice, and elevation Prednisone prescribed.   Take as directed and to completion Ankle brace given in office Follow up  with foot specialist for further evaluation and management Return or go to the ER if you have any new or worsening symptoms (fever, chills, chest pain, redness, swelling, bruising, worsening symptoms despite treatment, etc...)    Reviewed expectations re: course of current medical issues. Questions answered. Outlined signs and symptoms indicating need for more acute intervention. Patient verbalized understanding. After Visit Summary given.    Lestine Box, PA-C 09/10/19 [phone]

## 2019-09-11 ENCOUNTER — Emergency Department (HOSPITAL_COMMUNITY)
Admission: EM | Admit: 2019-09-11 | Discharge: 2019-09-11 | Disposition: A | Discharge status: Home / Self Care | Payer: BC Managed Care – PPO | Attending: Emergency Medicine | Admitting: Emergency Medicine

## 2019-09-11 ENCOUNTER — Encounter (HOSPITAL_COMMUNITY): Payer: Self-pay

## 2019-09-11 ENCOUNTER — Other Ambulatory Visit: Payer: Self-pay

## 2019-09-11 DIAGNOSIS — M79671 Pain in right foot: Secondary | ICD-10-CM | POA: Insufficient documentation

## 2019-09-11 DIAGNOSIS — R202 Paresthesia of skin: Secondary | ICD-10-CM | POA: Diagnosis not present

## 2019-09-11 DIAGNOSIS — G8929 Other chronic pain: Secondary | ICD-10-CM | POA: Diagnosis not present

## 2019-09-11 DIAGNOSIS — R600 Localized edema: Secondary | ICD-10-CM

## 2019-09-11 DIAGNOSIS — M25579 Pain in unspecified ankle and joints of unspecified foot: Secondary | ICD-10-CM | POA: Diagnosis present

## 2019-09-11 DIAGNOSIS — Z7984 Long term (current) use of oral hypoglycemic drugs: Secondary | ICD-10-CM | POA: Diagnosis not present

## 2019-09-11 NOTE — ED Provider Notes (Signed)
[facility] EMERGENCY DEPARTMENT Provider Note   CSN: [id number] Arrival date & time: 09/11/19  9983     History Chief Complaint  Patient presents with  . Ankle Pain    Gabriela Rush is a 54 y.o. female.  HPI   This patient is a pleasant 54 year old female, she has a history of diabetes and hypertension, unfortunately she has dealt with some pain in her ankle and foot over the last couple of months, in the last month she has seen a podiatrist who told her that her arch had collapsed and that she needed to wear inserts in her shoes, she then followed up with the urgent care yesterday because of increasing pain in the ankle, had x-rays which were negative, started on prednisone and encouraged to follow-up in the outpatient setting or in the emergency department if her pain got worse.  She states that the pain has not worsened but continues this morning and she is frustrated stating that she was told she could have an MRI and an evaluation for a blood clot in the emergency department.  She denies fevers, she has mild swelling just distal to the tip of the lateral malleolus, she does report that she had some swelling in her legs yesterday, she shares a picture with me that showed a slight asymmetry of the legs in the mid tibial area down.  She does not have that swelling today.  She denies fevers or chills, denies redness.  She has been ambulating with a cane secondary to the pain in the ankle  Past Medical History:  Diagnosis Date  . Diabetes mellitus without complication ([place])   . Family history of adverse reaction to anesthesia    Mother had problem with anesthesia; The "lost" her during a surgery; brought her back.  . Hypertension     Patient Active Problem List   Diagnosis Date Noted  . Special screening for malignant neoplasms, colon     Past Surgical History:  Procedure Laterality Date  . BREAST REDUCTION SURGERY    . CESAREAN SECTION  1988  . COLONOSCOPY N/A 10/20/2016    Procedure: COLONOSCOPY;  Surgeon: Danie Binder, MD;  [facility];  Service: Endoscopy;  Laterality: N/A;  1:00 pm     OB History   No obstetric history on file.     Family History  Problem Relation Age of Onset  . Hypertension Mother   . Stroke Mother   . Kidney failure Mother   . Hypertension Father   . Diabetes Father   . Hypertension Sister   . Diabetes Sister   . Hypertension Sister   . Diabetes Sister     Social History   Tobacco Use  . Smoking status: Never Smoker  . Smokeless tobacco: Never Used  Vaping Use  . Vaping Use: Never used  Substance Use Topics  . Alcohol use: Yes    Alcohol/week: 0.0 standard drinks    Comment: Occasionally  . Drug use: No    Home Medications Prior to Admission medications   Medication Sig Start Date End Date Taking? Authorizing Provider  atorvastatin (LIPITOR) 10 MG tablet Take by mouth.    [provider]  diphenhydramine-acetaminophen (TYLENOL PM) 25-500 MG TABS tablet Take 1 tablet by mouth at bedtime as needed (sleep).    [provider]  fluticasone (FLONASE) 50 MCG/ACT nasal spray Place 1 spray into both nostrils daily as needed for allergies or rhinitis.    [provider]  hydrochlorothiazide (HYDRODIURIL)  25 MG tablet Take 25 mg by mouth daily.    [provider]  metFORMIN (GLUCOPHAGE-XR) 500 MG 24 hr tablet Take 500 mg by mouth daily with breakfast.  04/30/17   [provider]  naproxen sodium (ALEVE) 220 MG tablet Take 440 mg by mouth daily as needed (pain).     [provider]  predniSONE (DELTASONE) 20 MG tablet Take 1 tablet (20 mg total) by mouth 2 (two) times daily with a meal for 5 days. 09/10/19 09/15/19  Lestine Box, PA-C    Allergies    Patient has no known allergies.  Review of Systems   Review of Systems  Constitutional: Negative for fever.  Musculoskeletal: Positive for arthralgias.  Skin: Negative for color change, rash and wound.    Neurological: Positive for numbness ( Chronic numbness in several toes after back surgery years ago). Negative for weakness.    Physical Exam Updated Vital Signs BP (!) 144/102 (BP Location: Left Arm)   Pulse 87   Temp 98 F (36.7 C) (Oral)   Resp 18   Ht 1.626 m (5\' 4" )   Wt 94.8 kg   LMP 08/11/2016   SpO2 99%   BMI 35.87 kg/m   Physical Exam Vitals and nursing note reviewed.  Constitutional:      Appearance: She is well-developed. She is not diaphoretic.  HENT:     Head: Normocephalic and atraumatic.  Eyes:     General:        Right eye: No discharge.        Left eye: No discharge.     Conjunctiva/sclera: Conjunctivae normal.  Cardiovascular:     Comments: Normal pulses at the feet bilaterally Pulmonary:     Effort: Pulmonary effort is normal. No respiratory distress.  Musculoskeletal:     Comments: There is no edema of the legs, legs are totally symmetrical, no tenderness over the bony structures of the foot or ankle on the right, there is a slight soft tissue predominance distal to the lateral malleolus but no ecchymosis or tenderness in that area, full range of motion of the joint without any pain  Skin:    General: Skin is warm and dry.     Findings: No erythema or rash.  Neurological:     Mental Status: She is alert.     Coordination: Coordination normal.     Comments: Slight decrease sensation to several toes, she states this is chronic, otherwise strength is normal at the joints of the right lower extremity both the knee and the ankle     ED Results / Procedures / Treatments   Labs (all labs ordered are listed, but only abnormal results are displayed) Labs Reviewed - No data to display  EKG None  Radiology DG Ankle Complete Right  Result Date: 09/10/2019 CLINICAL DATA:  Pain and swelling EXAM: RIGHT ANKLE - COMPLETE 3+ VIEW COMPARISON:  None. FINDINGS: Frontal, oblique, and lateral views were obtained. No evident fracture or joint effusion. No joint  space narrowing or erosion. Ankle mortise appears intact. There is a small posterior calcaneal spur. IMPRESSION: No evident fracture or arthropathy. Small posterior calcaneal spur. Ankle mortise appears intact. Electronically Signed   By: Lowella Grip III M.D.   On: 09/10/2019 09:11    Procedures Procedures (including critical care time)  Medications Ordered in ED Medications - No data to display  ED Course  I have reviewed the triage vital signs and the nursing notes.  Pertinent labs & imaging results  that were available during my care of the patient were reviewed by me and considered in my medical decision making (see chart for details).    MDM Rules/Calculators/A&P                          This patient is frustrated by the lack of diagnosis of her ongoing pain in her foot.  She has nothing acute on exam, she did report some swelling which I had observed in the picture she shared with me.  I have ordered an ultrasound to be performed in the morning.  I have informed the patient that we do not do MRI for these type of findings, this can be done as an outpatient, she is very frustrated by this answer but agreeable.  She will continue the prednisone that was prescribed at the urgent care and is aware that this may cause some increasing hyperglycemia.  She is stable for discharge with no evidence of infection, gout, severe arthritis, fracture, tumor or other cause of her pain.  Final Clinical Impression(s) / ED Diagnoses Final diagnoses:  Foot pain, right    Rx / DC Orders ED Discharge Orders         Ordered    US Venous Img Lower Unilateral Right     Discontinue     09/11/19 0859           Noemi Chapel, MD 09/11/19 [phone]

## 2019-09-11 NOTE — Discharge Instructions (Signed)
I have reviewed the x-rays of your ankle which shows no sign of fracture or significant injury.  You do have some inflammation in your foot and ankle, you may benefit from taking the prednisone though please be aware that that medication may cause your blood sugar to go up.  I have ordered an ultrasound to be done tomorrow morning, please call the phone number attached on these papers to make the appointment.  Please come back to the emergency department for fevers or severe swelling otherwise your family doctor, orthopedist or podiatrist can follow-up with you and order an MRI if they feel it is necessary.  Most of the time pain like this is related to ligaments or tendons that are inflamed and should go down over time.  Please stay out of work tonight

## 2019-09-11 NOTE — ED Triage Notes (Signed)
Pt reports pain in right ankle for at least 2 months. Pt has seen podiatrist and urgent  Care yesterday . X rays have been normal. Pt reports swelling to lateral ankle. Pt given prednisone at urgent care

## 2019-09-12 ENCOUNTER — Ambulatory Visit (HOSPITAL_COMMUNITY)
Admission: RE | Admit: 2019-09-12 | Discharge: 2019-09-12 | Disposition: A | Discharge status: Home / Self Care | Payer: BC Managed Care – PPO | Source: Ambulatory Visit | Attending: Emergency Medicine | Admitting: Emergency Medicine

## 2019-09-12 DIAGNOSIS — R600 Localized edema: Secondary | ICD-10-CM | POA: Diagnosis present

## 2019-09-12 NOTE — ED Provider Notes (Signed)
Ultrasound results do not reveal any evidence of DVT.    Results from the ER workup discussed with the patient face to face and all questions answered to the best of my ability. The patient is safe for discharge with strict return precautions.    Varney Biles, MD 09/12/19 1239

## 2019-12-23 IMAGING — XA DG MYELOGRAPHY LUMBAR INJ LUMBOSACRAL
7 of 9 series · 7 of 9 positions shown · non-contrast
Comparison: Radiography 09/17/2015

CLINICAL DATA: Low back pain. Bilateral leg pain and numbness right
worse than left.
TECHNIQUE: Contiguous axial images were obtained through the Lumbar spine after
the intrathecal infusion of infusion. Coronal and sagittal
reconstructions were obtained of the axial image sets.

[Series 1: w lumbar spine lat · 0.15mm/px · 1 of 1 slices shown]
[im 1/1]
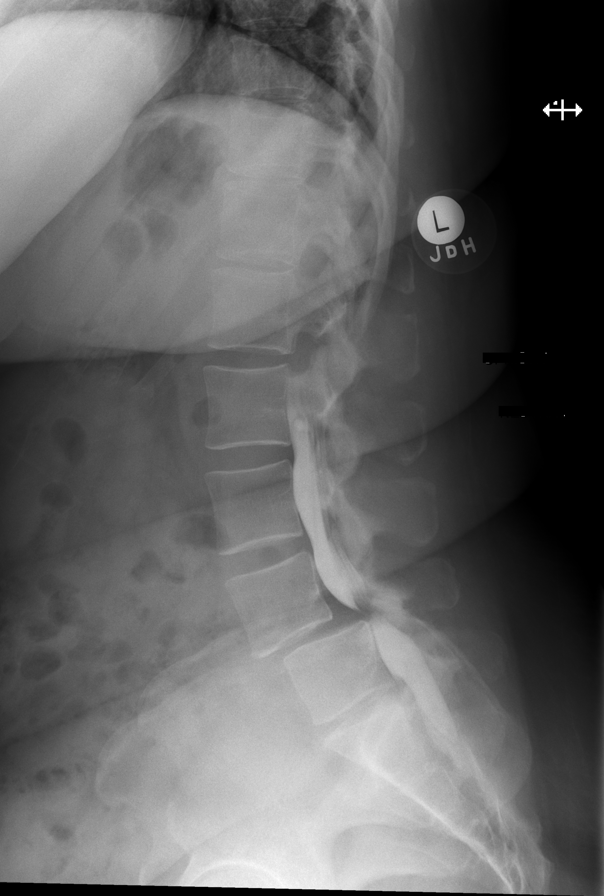

[Series 1: vasc adipose · 1 of 1 slices shown (1 of 4)]
[im 1/1]
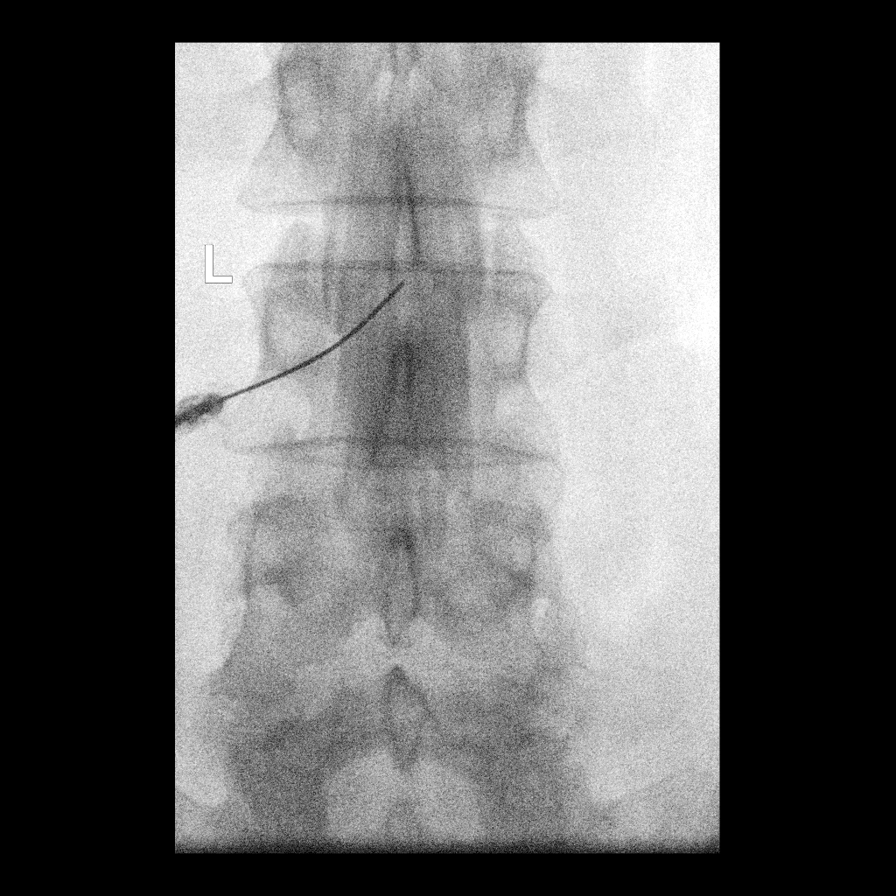

[Series 2: vasc adipose · 1 of 1 slices shown (2 of 4)]
[im 1/1]
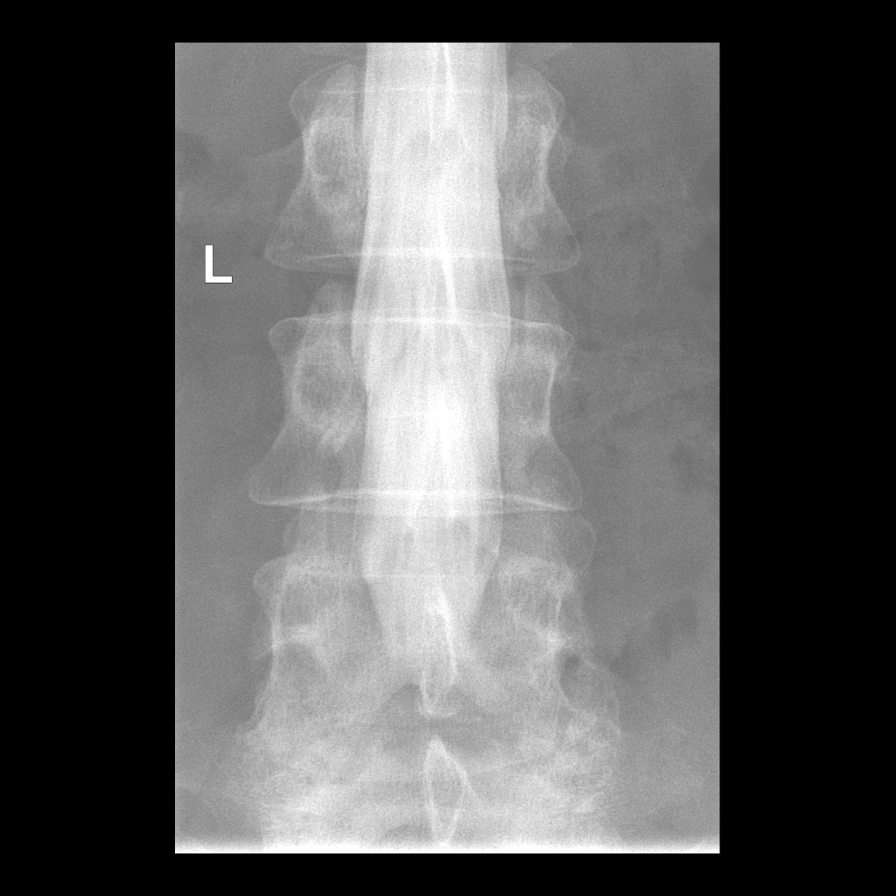

[Series 2: w lumbar spine flexion · 0.15mm/px · 1 of 1 slices shown]
[im 1/1]
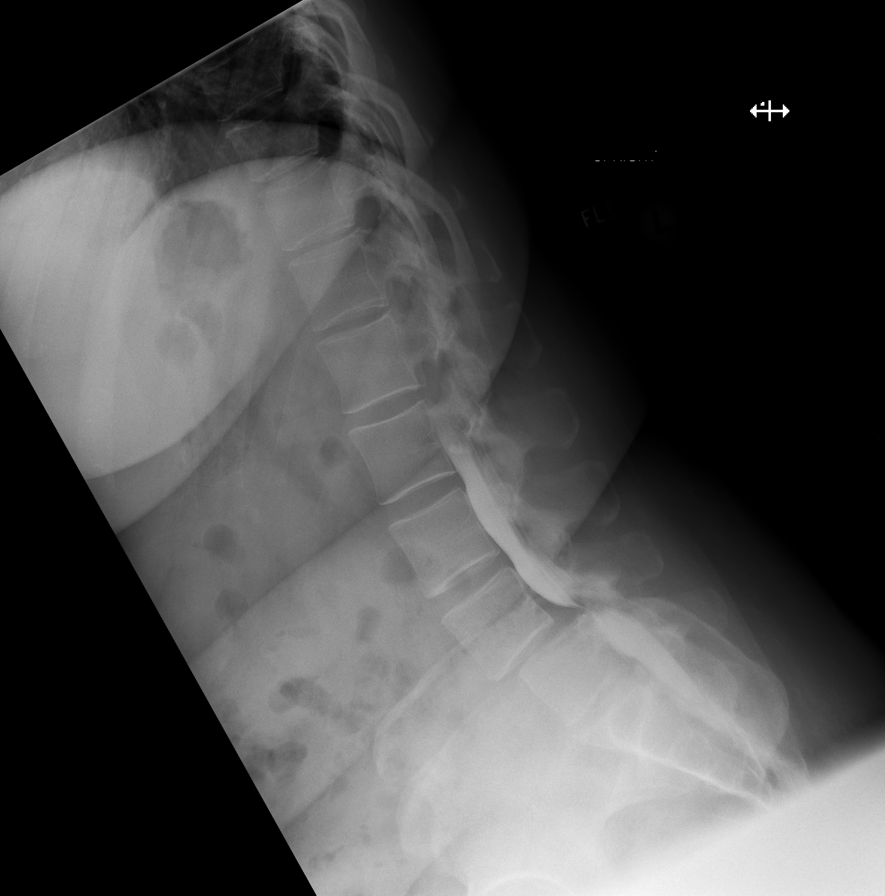

[Series 3: w lumbar spine extension · 0.15mm/px · 1 of 1 slices shown]
[im 1/1]
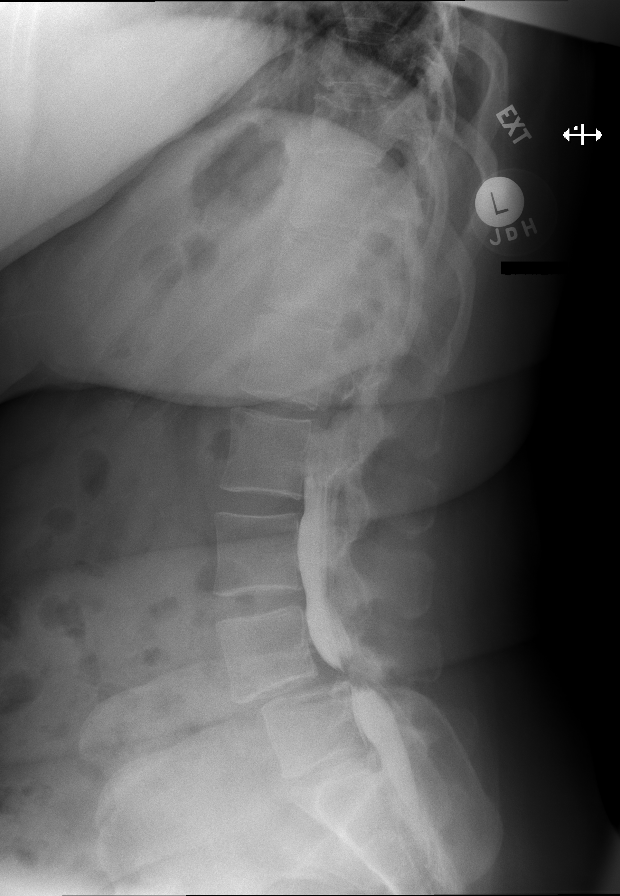

[Series 3: vasc adipose · 1 of 1 slices shown (3 of 4)]
[im 1/1]
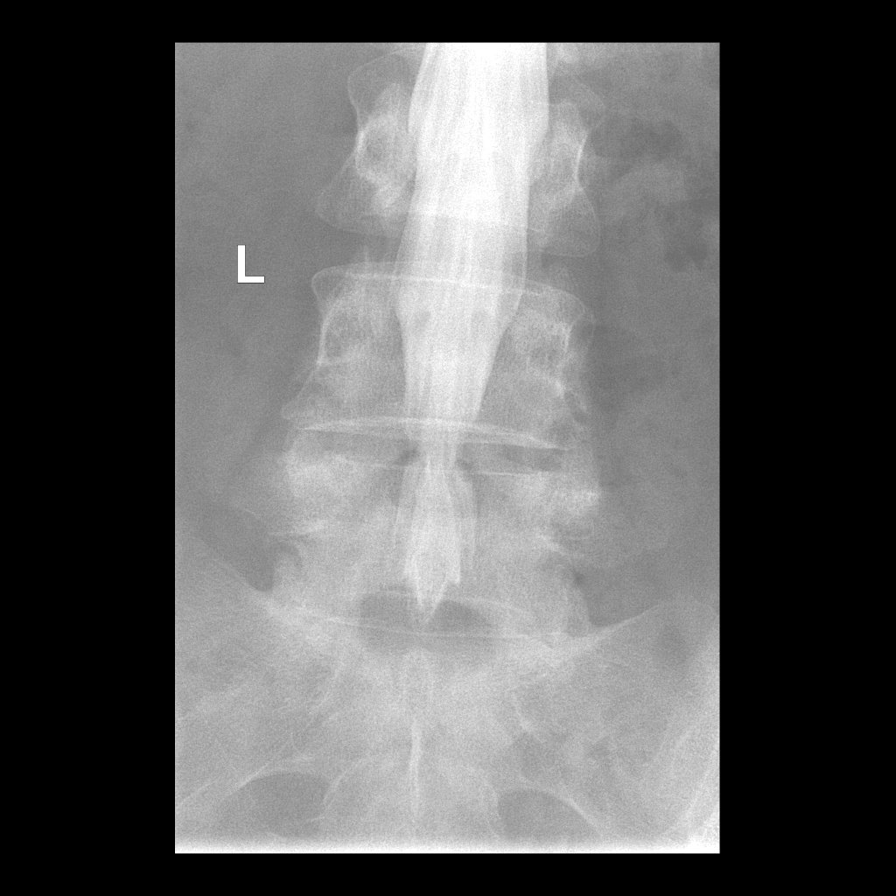

[Series 4: vasc adipose · 1 of 1 slices shown (4 of 4)]
[im 1/1]
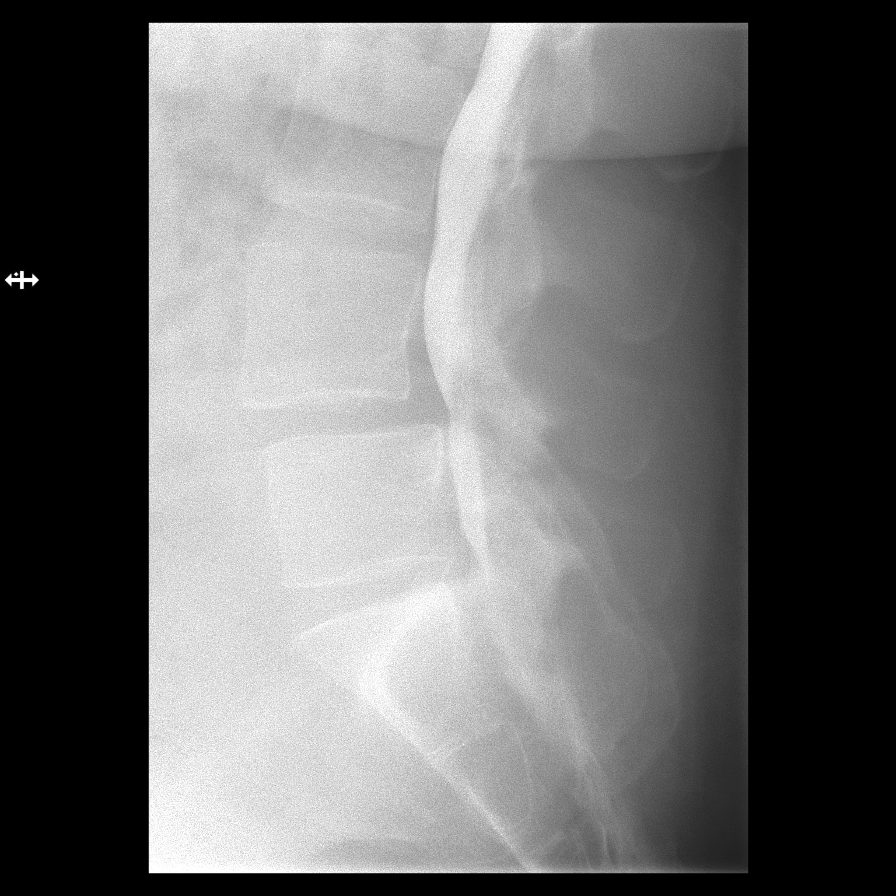

[7 of 9 positions shown; findings below may reference images not displayed]

EXAM:
LUMBAR MYELOGRAM

FLUOROSCOPY TIME:  0 minutes 51 seconds. 145.38 micro gray meter
squared

PROCEDURE:
After thorough discussion of risks and benefits of the procedure
including bleeding, infection, injury to nerves, blood vessels,
adjacent structures as well as headache and CSF leak, written and
oral informed consent was obtained. Consent was obtained by Dr. Wilcher
Moolman. Time out form was completed.

Patient was positioned prone on the fluoroscopy table. Local
anesthesia was provided with 1% lidocaine without epinephrine after
prepped and draped in the usual sterile fashion. Puncture was
performed at L2-3 using a 3 1/2 inch 22-gauge spinal needle via left
para median approach. Using a single pass through the dura, the
needle was placed within the thecal sac, with return of clear CSF.
15 mL of Isovue K-FFF was injected into the thecal sac, with normal
opacification of the nerve roots and cauda equina consistent with
free flow within the subarachnoid space.

I personally performed the lumbar puncture and administered the
intrathecal contrast. I also personally performed acquisition of the
myelogram images.
FINDINGS: LUMBAR MYELOGRAM FINDINGS:

In the supine position, there is moderate multifactorial stenosis at
L4-5 which could cause neural compression in either lateral recess.
There is anterolisthesis 1 cm in the supine position. This increases
to 16 mm with standing and to 19 mm with standing flexion. No
compressive stenosis seen at L5-S1.

CT LUMBAR MYELOGRAM FINDINGS:

No abnormality at L3-4 or above. The discs are normal. The canal and
foramina are widely patent. The distal cord and conus are normal.
Minimal facet arthritis at L3-4 but no slippage or stenosis.

L4-5: Advanced bilateral facet arthropathy with gaping, fluid-filled
facet joints. Anterolisthesis of 6 mm in this position.
Circumferential protrusion of the disc with foraminal extension on
the right. Neural compression could occur in either lateral recess
and certainly in the foramen on the right.

L5-S1: Facet osteoarthritis without slippage. Bulging of the disc.
No compressive stenosis at this level.

Bilateral sacroiliac osteoarthritis.
IMPRESSION: Degenerative spondylolisthesis at L4-5, measuring up to nearly 2 cm
with standing and flexion. Spinal stenosis at this level that could
cause neural compression in the canal. Right foraminal to
extraforaminal disc material likely to focally compress the right L4
nerve in addition.

L5-S1 facet osteoarthritis but no slippage or stenosis.

## 2019-12-23 IMAGING — CT CT L SPINE W/ CM
2 of 8 series · 4 of 14 positions shown, 5 images · non-contrast
Comparison: Radiography 09/17/2015

CLINICAL DATA: Low back pain. Bilateral leg pain and numbness right
worse than left.
TECHNIQUE: Contiguous axial images were obtained through the Lumbar spine after
the intrathecal infusion of infusion. Coronal and sagittal
reconstructions were obtained of the axial image sets.

[Series 3: l spine soft · axial · 0.35mm/px · z∈[-187,-118]mm · 2 of 71 slices shown]
[im 24/71  soft-tissue]
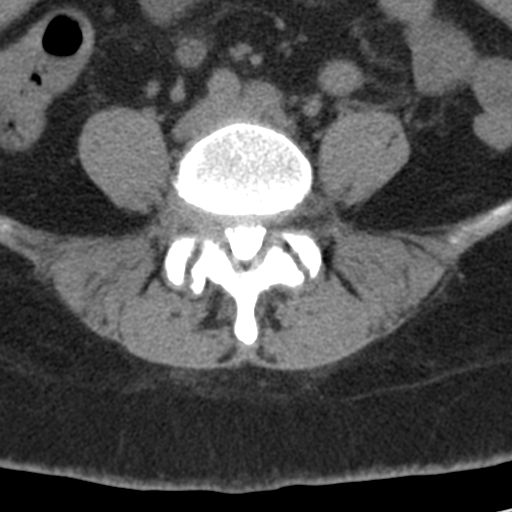
[im 47/71  soft-tissue]
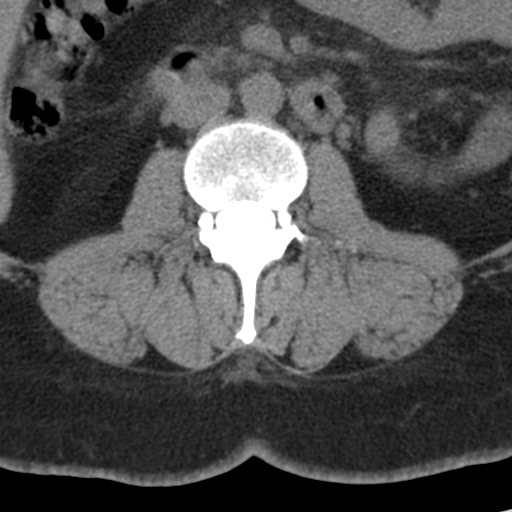

[Series 4: l spine bone · axial · 0.35mm/px · z∈[-184,-115]mm · 2 of 70 slices shown, 3 images]
[im 24/70  soft-tissue]
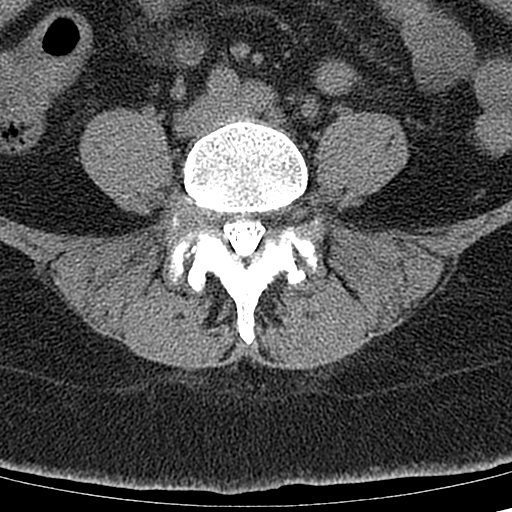
[im 24/70  bone]
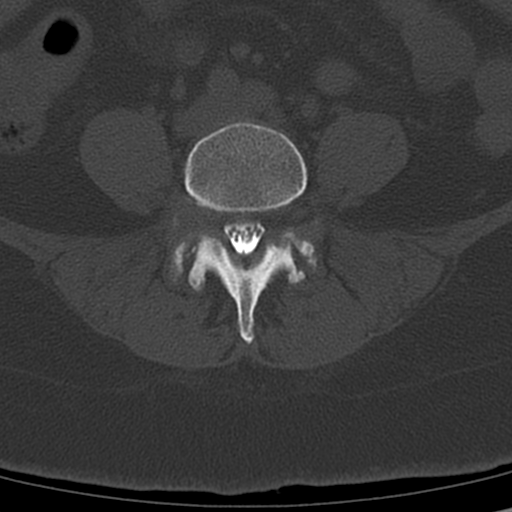
[im 47/70  bone]
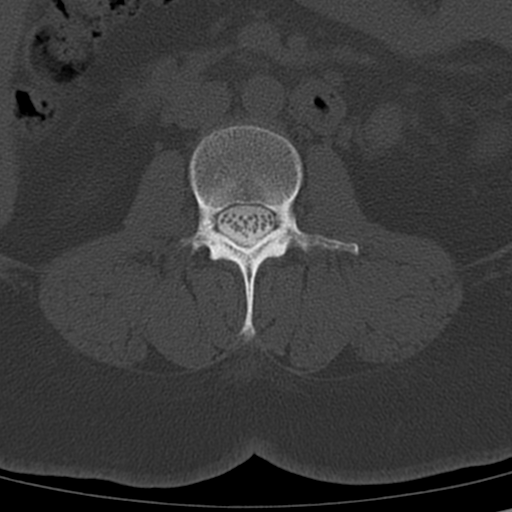

[4 of 14 positions shown; findings below may reference images not displayed]

EXAM:
LUMBAR MYELOGRAM

FLUOROSCOPY TIME:  0 minutes 51 seconds. 145.38 micro gray meter
squared

PROCEDURE:
After thorough discussion of risks and benefits of the procedure
including bleeding, infection, injury to nerves, blood vessels,
adjacent structures as well as headache and CSF leak, written and
oral informed consent was obtained. Consent was obtained by Dr. Wilcher
Moolman. Time out form was completed.

Patient was positioned prone on the fluoroscopy table. Local
anesthesia was provided with 1% lidocaine without epinephrine after
prepped and draped in the usual sterile fashion. Puncture was
performed at L2-3 using a 3 1/2 inch 22-gauge spinal needle via left
para median approach. Using a single pass through the dura, the
needle was placed within the thecal sac, with return of clear CSF.
15 mL of Isovue K-FFF was injected into the thecal sac, with normal
opacification of the nerve roots and cauda equina consistent with
free flow within the subarachnoid space.

I personally performed the lumbar puncture and administered the
intrathecal contrast. I also personally performed acquisition of the
myelogram images.
FINDINGS: LUMBAR MYELOGRAM FINDINGS:

In the supine position, there is moderate multifactorial stenosis at
L4-5 which could cause neural compression in either lateral recess.
There is anterolisthesis 1 cm in the supine position. This increases
to 16 mm with standing and to 19 mm with standing flexion. No
compressive stenosis seen at L5-S1.

CT LUMBAR MYELOGRAM FINDINGS:

No abnormality at L3-4 or above. The discs are normal. The canal and
foramina are widely patent. The distal cord and conus are normal.
Minimal facet arthritis at L3-4 but no slippage or stenosis.

L4-5: Advanced bilateral facet arthropathy with gaping, fluid-filled
facet joints. Anterolisthesis of 6 mm in this position.
Circumferential protrusion of the disc with foraminal extension on
the right. Neural compression could occur in either lateral recess
and certainly in the foramen on the right.

L5-S1: Facet osteoarthritis without slippage. Bulging of the disc.
No compressive stenosis at this level.

Bilateral sacroiliac osteoarthritis.
IMPRESSION: Degenerative spondylolisthesis at L4-5, measuring up to nearly 2 cm
with standing and flexion. Spinal stenosis at this level that could
cause neural compression in the canal. Right foraminal to
extraforaminal disc material likely to focally compress the right L4
nerve in addition.

L5-S1 facet osteoarthritis but no slippage or stenosis.

## 2020-06-23 IMAGING — DX DG LUMBAR SPINE 2-3V
3 series · 3 of 3 positions shown · non-contrast
Comparison: Lumbar spine films of 10/19/2016

CLINICAL DATA: Post lumbar spine fusion, some numbness in the right
lower leg to the foot

EXAM:
LUMBAR SPINE - 2-3 VIEW

[l-spine ap]
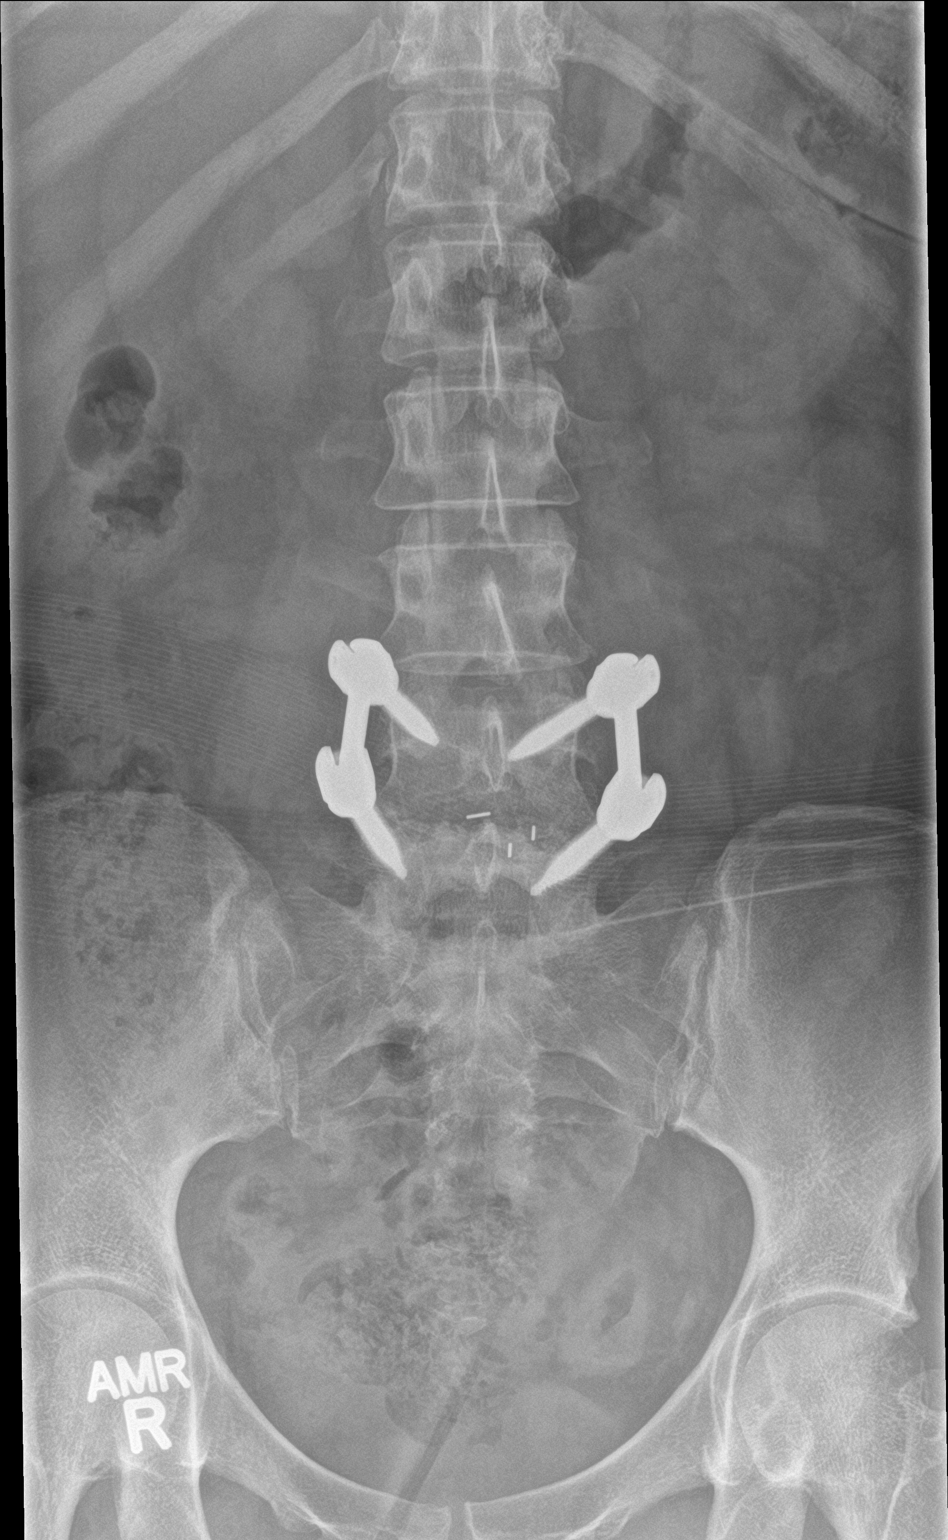

[l-spine lat]
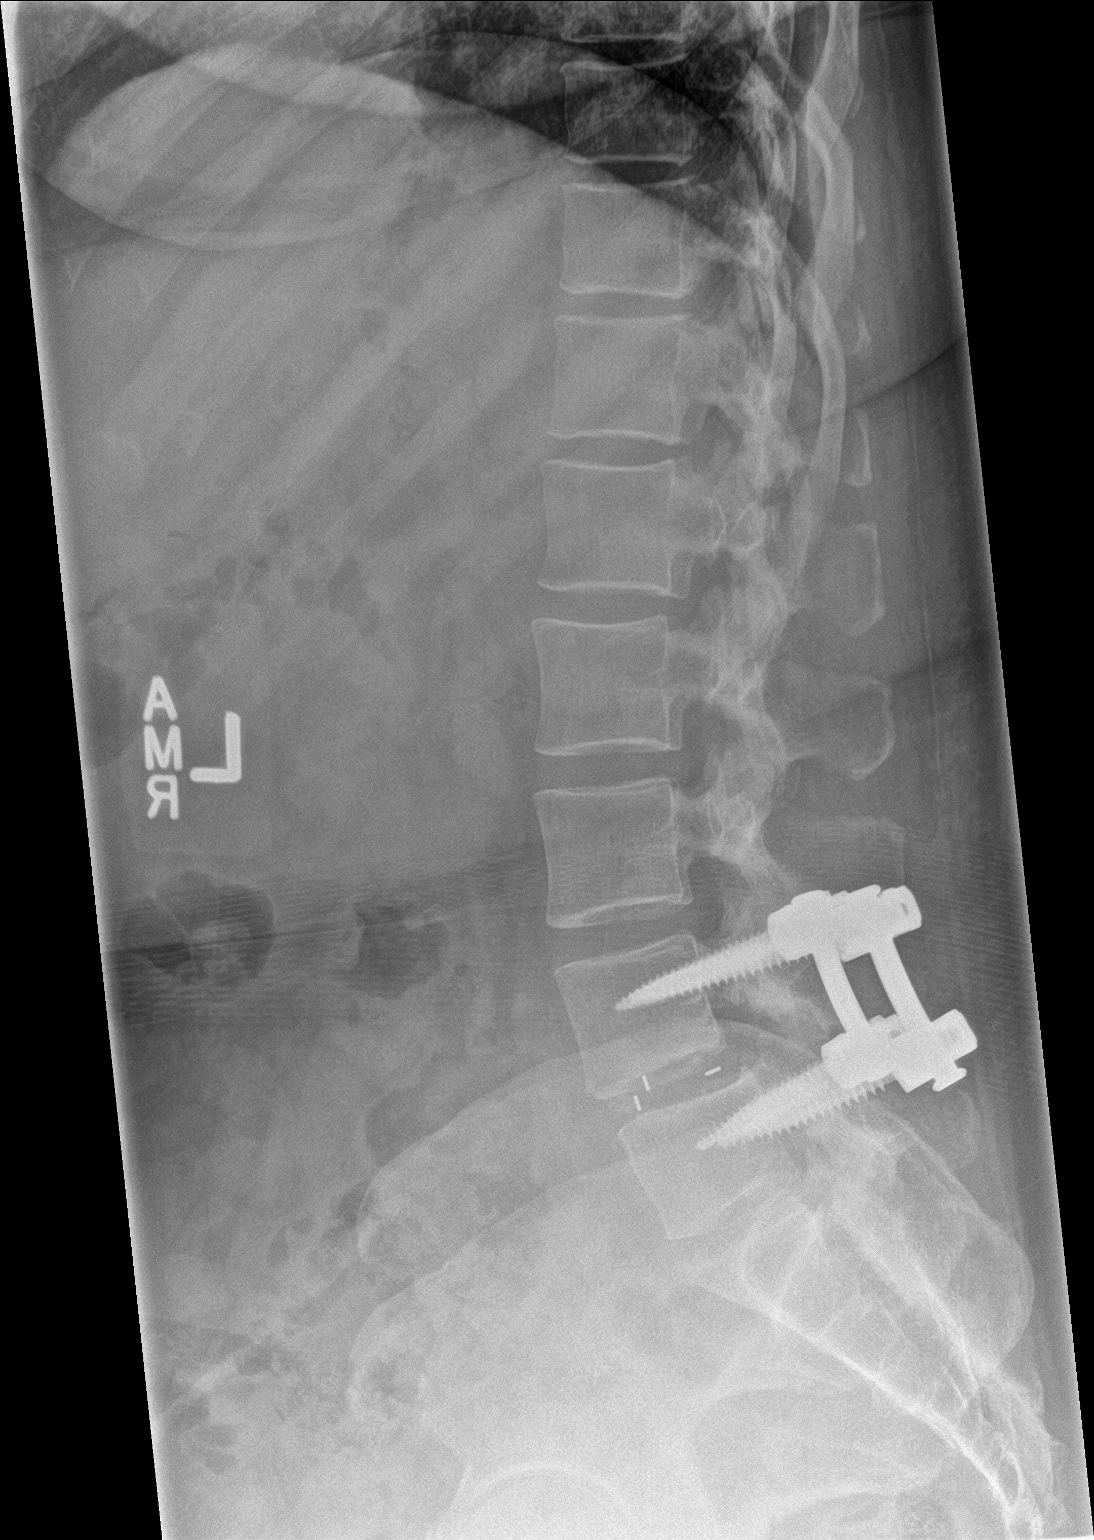

[l-spine spot]
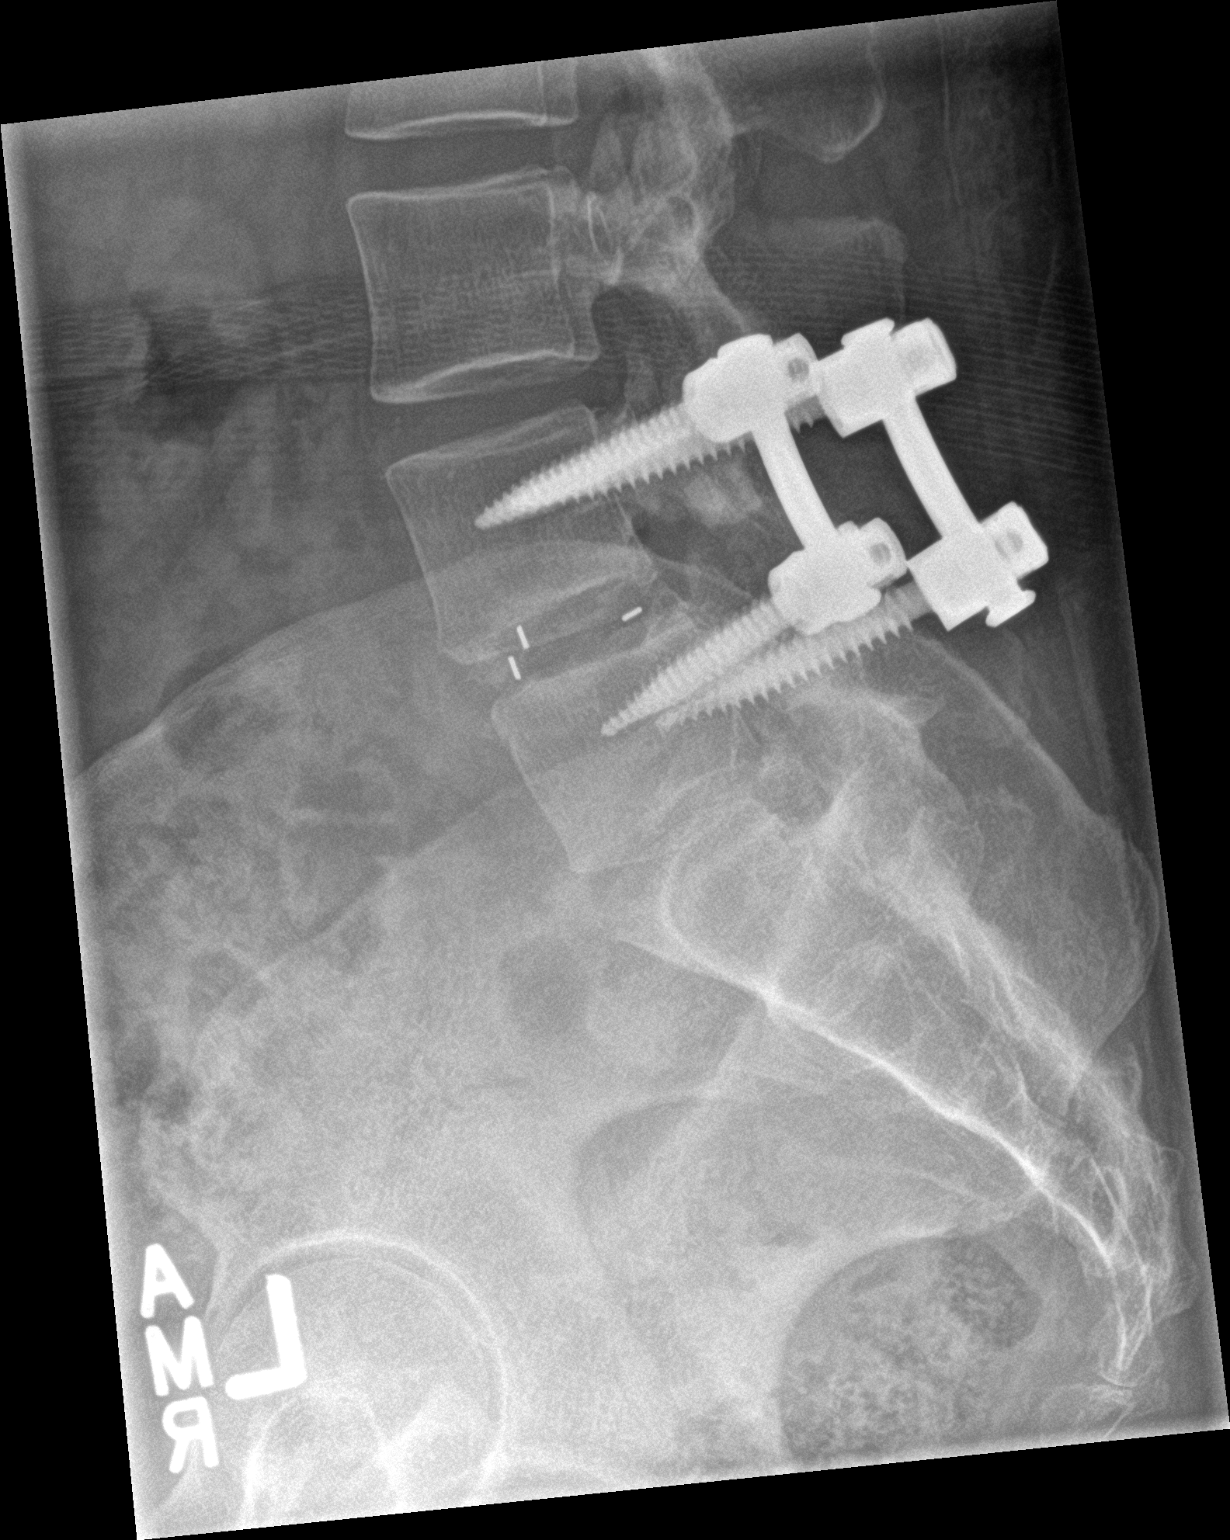

[3 of 3 positions shown; findings below may reference images not displayed]

FINDINGS: The lumbar vertebrae remain in normal alignment. Posterior hardware
for fusion at L4-5 appears unchanged in position. Interbody fusion
plug at L4-5 also appears unchanged in position.
IMPRESSION: Stable hardware for posterior fusion at L4-5.  No interval change.

## 2021-09-19 ENCOUNTER — Encounter: Payer: Self-pay | Admitting: *Deleted

## 2021-10-08 ENCOUNTER — Encounter: Payer: Self-pay | Admitting: *Deleted

## 2021-10-08 NOTE — Patient Instructions (Addendum)
Referring MD/PCP: Dr. Quintin Alto  Reason/Indication:  Colonoscopy  Has patient had this procedure before?  10/20/16, Dr. Oneida Alar  If so, when, by whom and where?    Is there a family history of colon cancer?  no  Who?  What age when diagnosed?    Is patient diabetic? yes      Does patient have prosthetic heart valve or mechanical valve?  no  Do you have a pacemaker/defibrillator?  no  Has patient ever had endocarditis/atrial fibrillation? no  Does patient use oxygen? no  Has patient had joint replacement within last 12 months?  no  Is patient constipated or do they take laxatives? yes  Does patient have a history of alcohol/drug use?  no  Have you had a stroke/heart attack last 6 mths? no  Do you take medicine for weight loss?  no  For female patients,: have you had a hysterectomy no                      are you post menopausal yes                      do you still have your menstrual cycle no  Is patient on blood thinner such as Coumadin, Plavix and/or Aspirin? no  Medications:  Current Outpatient Medications on File Prior to Visit  Medication Sig Dispense Refill   acetaminophen (TYLENOL) 500 MG tablet Take 500 mg by mouth every 6 (six) hours as needed.     losartan-hydrochlorothiazide (HYZAAR) 100-25 MG tablet Take 1 tablet by mouth daily.     senna (SENOKOT) 8.6 MG tablet Take 1 tablet by mouth daily.     atorvastatin (LIPITOR) 10 MG tablet Take by mouth.     metFORMIN (GLUCOPHAGE-XR) 500 MG 24 hr tablet Take 500 mg by mouth in the morning and at bedtime.     No current facility-administered medications on file prior to visit.    Allergies: No Known Allergies   Estimated body mass index is 35.87 kg/m as calculated from the following:   Height as of 09/11/19: '5\' 4"'$  (1.626 m).   Weight as of 09/11/19: 209 lb (94.8 kg).

## 2021-10-09 NOTE — Progress Notes (Signed)
BMI less than 40. Colonoscopy in 2018 with two adenomas.    No metformin day of procedure. Needs BMP prior as on diuretic. ASA 2.

## 2021-10-10 ENCOUNTER — Encounter: Payer: Self-pay | Admitting: *Deleted

## 2021-10-10 DIAGNOSIS — Z86010 Personal history of colonic polyps: Secondary | ICD-10-CM

## 2021-10-10 MED ORDER — PEG 3350-KCL-NA BICARB-NACL 420 G PO SOLR: ORAL | 0 refills | Status: DC

## 2021-10-10 NOTE — Progress Notes (Signed)
Called pt. Scheduled for 8/4 at 1:30pm. Aware will mail instructoins. Lab ordered. Rx sent to pharmacy

## 2021-10-10 NOTE — Addendum Note (Signed)
Addended by: Cheron Every on: 10/10/2021 01:25 PM   Modules accepted: Orders

## 2021-10-24 ENCOUNTER — Other Ambulatory Visit (HOSPITAL_COMMUNITY)
Admission: RE | Admit: 2021-10-24 | Discharge: 2021-10-24 | Disposition: A | Discharge status: Home / Self Care | Payer: BC Managed Care – PPO | Source: Ambulatory Visit | Attending: Internal Medicine | Admitting: Internal Medicine

## 2021-10-24 DIAGNOSIS — Z86010 Personal history of colonic polyps: Secondary | ICD-10-CM | POA: Insufficient documentation

## 2021-10-24 LAB — BASIC METABOLIC PANEL
Anion gap: 10 (ref 5–15)
BUN: 34 mg/dL — ABNORMAL HIGH (ref 6–20)
CO2: 25 mmol/L (ref 22–32)
Calcium: 9.7 mg/dL (ref 8.9–10.3)
Chloride: 106 mmol/L (ref 98–111)
Creatinine, Ser: 0.98 mg/dL (ref 0.44–1.00)
GFR, Estimated: >60 mL/min (ref >60)
Glucose, Bld: 127 mg/dL — ABNORMAL HIGH (ref 70–99)
Potassium: 3.9 mmol/L (ref 3.5–5.1)
Sodium: 141 mmol/L (ref 135–145)

## 2021-10-28 ENCOUNTER — Telehealth: Payer: Self-pay | Admitting: *Deleted

## 2021-10-28 NOTE — Telephone Encounter (Signed)
Spoke with pt. Aware need to r/s procedure for 8/4. She will call back after she checks with her boss to see which date she can r/s too.

## 2021-11-01 ENCOUNTER — Ambulatory Visit (HOSPITAL_COMMUNITY): Admission: RE | Admit: 2021-11-01 | Payer: BC Managed Care – PPO | Source: Home / Self Care

## 2021-11-01 ENCOUNTER — Encounter (HOSPITAL_COMMUNITY): Admission: RE | Payer: Self-pay | Source: Home / Self Care

## 2021-11-01 SURGERY — COLONOSCOPY WITH PROPOFOL
Anesthesia: Monitor Anesthesia Care

## 2021-11-25 ENCOUNTER — Encounter: Payer: Self-pay | Admitting: *Deleted

## 2021-11-25 ENCOUNTER — Telehealth: Payer: Self-pay | Admitting: *Deleted

## 2021-11-25 NOTE — Telephone Encounter (Signed)
This pt had a colonoscopy scheduled for 11/01/21 but ended up having to reschedule due to Dr. Abbey Chatters being off that day. She had went and had a BMP done. I advised pt that she would need to have another one done because it has to be within 30 days.  She says that when she had the blood work done, that they really hurt her because they couldn't find a vein. She doesn't want to have it repeated. Please advise. Thank you

## 2021-11-25 NOTE — Telephone Encounter (Signed)
Patient called back to reschedule her colonoscopy. She was able to do 12/13/21 at 1:30 pm.

## 2021-11-26 ENCOUNTER — Other Ambulatory Visit: Payer: Self-pay | Admitting: *Deleted

## 2021-11-26 DIAGNOSIS — Z1211 Encounter for screening for malignant neoplasm of colon: Secondary | ICD-10-CM

## 2021-11-26 NOTE — Telephone Encounter (Signed)
Hi! I am sorry, it is part of policy. She will have to have this repeated.

## 2021-11-26 NOTE — Telephone Encounter (Signed)
Patient informed of provider's message. She states she will give them 2 sticks and if they can't get the blood then that was it. She also stated that she just had blood work done last week at her doctors office, she is calling to see if results can be faxed to our office.

## 2021-11-27 ENCOUNTER — Encounter: Payer: Self-pay | Admitting: *Deleted

## 2021-11-27 NOTE — Telephone Encounter (Signed)
Pt informed that her lab work that was drawn from her PCP office is sufficient for her procedure.

## 2021-11-27 NOTE — Telephone Encounter (Signed)
I am not sure: please check with anesthesia pre-op to see if we can use them within the time frame. Thanks, ladies!

## 2021-11-27 NOTE — Telephone Encounter (Signed)
Ok to use CMET instead of BMET per Miller at [facility].  Please see LAB under media.  [place]

## 2021-11-27 NOTE — Telephone Encounter (Signed)
Pt had labs done at her PCP office. She had them faxed over.  Does she still need to get BMP done? Please advise. Thank you

## 2021-12-10 ENCOUNTER — Other Ambulatory Visit: Payer: Self-pay

## 2021-12-10 ENCOUNTER — Encounter (HOSPITAL_COMMUNITY)
Admission: RE | Admit: 2021-12-10 | Discharge: 2021-12-10 | Disposition: A | Discharge status: Home / Self Care | Payer: BC Managed Care – PPO | Source: Ambulatory Visit | Attending: Internal Medicine | Admitting: Internal Medicine

## 2021-12-10 ENCOUNTER — Encounter (HOSPITAL_COMMUNITY): Payer: Self-pay

## 2021-12-10 VITALS — Ht 65.0 in | Wt 209.0 lb

## 2021-12-10 DIAGNOSIS — I10 Essential (primary) hypertension: Secondary | ICD-10-CM

## 2021-12-13 ENCOUNTER — Ambulatory Visit (HOSPITAL_COMMUNITY)
Admission: RE | Admit: 2021-12-13 | Discharge: 2021-12-13 | Disposition: A | Discharge status: Home / Self Care | Payer: BC Managed Care – PPO | Attending: Internal Medicine | Admitting: Internal Medicine

## 2021-12-13 ENCOUNTER — Encounter (HOSPITAL_COMMUNITY)
Admission: RE | Disposition: A | Discharge status: Home / Self Care | Payer: Self-pay | Source: Home / Self Care | Attending: Internal Medicine

## 2021-12-13 ENCOUNTER — Encounter (HOSPITAL_COMMUNITY): Payer: Self-pay

## 2021-12-13 ENCOUNTER — Ambulatory Visit (HOSPITAL_COMMUNITY): Payer: BC Managed Care – PPO | Admitting: Anesthesiology

## 2021-12-13 DIAGNOSIS — Z86010 Personal history of colonic polyps: Secondary | ICD-10-CM | POA: Diagnosis not present

## 2021-12-13 DIAGNOSIS — I10 Essential (primary) hypertension: Secondary | ICD-10-CM | POA: Insufficient documentation

## 2021-12-13 DIAGNOSIS — Z79899 Other long term (current) drug therapy: Secondary | ICD-10-CM | POA: Insufficient documentation

## 2021-12-13 DIAGNOSIS — E119 Type 2 diabetes mellitus without complications: Secondary | ICD-10-CM | POA: Insufficient documentation

## 2021-12-13 DIAGNOSIS — Z1211 Encounter for screening for malignant neoplasm of colon: Secondary | ICD-10-CM | POA: Insufficient documentation

## 2021-12-13 DIAGNOSIS — K648 Other hemorrhoids: Secondary | ICD-10-CM | POA: Diagnosis not present

## 2021-12-13 DIAGNOSIS — Z7984 Long term (current) use of oral hypoglycemic drugs: Secondary | ICD-10-CM | POA: Diagnosis not present

## 2021-12-13 DIAGNOSIS — K635 Polyp of colon: Secondary | ICD-10-CM

## 2021-12-13 DIAGNOSIS — Z09 Encounter for follow-up examination after completed treatment for conditions other than malignant neoplasm: Secondary | ICD-10-CM

## 2021-12-13 HISTORY — PX: COLONOSCOPY WITH PROPOFOL: SHX5780

## 2021-12-13 HISTORY — PX: POLYPECTOMY: SHX5525

## 2021-12-13 LAB — GLUCOSE, CAPILLARY: Glucose-Capillary: 115 mg/dL — ABNORMAL HIGH (ref 70–99)

## 2021-12-13 SURGERY — COLONOSCOPY WITH PROPOFOL
Anesthesia: General

## 2021-12-13 MED ORDER — MIDAZOLAM HCL 2 MG/2ML IJ SOLN
INTRAMUSCULAR | Status: AC
  Filled 2021-12-13: qty 2

## 2021-12-13 MED ORDER — PROPOFOL 10 MG/ML IV BOLUS
INTRAVENOUS | Status: DC | PRN
  Administered 2021-12-13: 100 mg via INTRAVENOUS
  Administered 2021-12-13: 150 ug/kg/min via INTRAVENOUS

## 2021-12-13 MED ORDER — LACTATED RINGERS IV SOLN: INTRAVENOUS | Status: DC

## 2021-12-13 MED ORDER — MIDAZOLAM HCL 2 MG/2ML IJ SOLN
2.0000 mg | Freq: Once | INTRAMUSCULAR | Status: AC
  Administered 2021-12-13: 2 mg via INTRAVENOUS

## 2021-12-13 NOTE — Transfer of Care (Signed)
Immediate Anesthesia Transfer of Care Note  Patient: Gabriela Rush  Procedure(s) Performed: COLONOSCOPY WITH PROPOFOL POLYPECTOMY  Patient Location: Short Stay  Anesthesia Type:General  Level of Consciousness: drowsy  Airway & Oxygen Therapy: Patient connected to nasal cannula oxygen  Post-op Assessment: Report given to RN and Post -op Vital signs reviewed and stable  Post vital signs: Reviewed and stable  Last Vitals:  Vitals Value Taken Time  BP 99/64 12/13/21 0929  Temp 36.8 C 12/13/21 0929  Pulse 83 12/13/21 0929  Resp 20 12/13/21 0929  SpO2 95 % 12/13/21 0929    Last Pain:  Vitals:   12/13/21 0929  TempSrc: Oral  PainSc: 0-No pain         Complications: No notable events documented.

## 2021-12-13 NOTE — H&P (Signed)
Primary Care Physician:  Manon Hilding, MD Primary Gastroenterologist:  Dr. Abbey Chatters  Pre-Procedure History & Physical: HPI:  Gabriela Rush is a 56 y.o. female is here for a colonoscopy to be performed for surveillance purposes, personal history of adenomatous colon polyps 2018.  Past Medical History:  Diagnosis Date   Diabetes mellitus without complication (HCC)    Family history of adverse reaction to anesthesia    Mother had problem with anesthesia; The "lost" her during a surgery; brought her back.   Hypertension     Past Surgical History:  Procedure Laterality Date   BACK SURGERY     disc surgery   BREAST REDUCTION SURGERY     CESAREAN SECTION  1988   COLONOSCOPY N/A 10/20/2016   Procedure: COLONOSCOPY;  Surgeon: Danie Binder, MD;  Location: [facility];  Service: Endoscopy;  Laterality: N/A;  1:00 pm    Prior to Admission medications   Medication Sig Start Date End Date Taking? Authorizing Provider  acetaminophen (TYLENOL) 500 MG tablet Take 1,000 mg by mouth every 6 (six) hours as needed for moderate pain.   Yes [provider]  atorvastatin (LIPITOR) 10 MG tablet Take 10 mg by mouth daily.   Yes [provider]  diphenhydramine-acetaminophen (TYLENOL PM) 25-500 MG TABS tablet Take 1 tablet by mouth at bedtime as needed (sleep).   Yes [provider]  losartan-hydrochlorothiazide (HYZAAR) 100-25 MG tablet Take 1 tablet by mouth daily.   Yes [provider]  metFORMIN (GLUCOPHAGE-XR) 500 MG 24 hr tablet Take 500-1,000 mg by mouth See admin instructions. Take 1000 mg in the morning and 500 mg at noon 04/30/17  Yes [provider]  senna (SENOKOT) 8.6 MG tablet Take 1 tablet by mouth daily as needed for constipation.   Yes [provider]  fluticasone (FLONASE) 50 MCG/ACT nasal spray Place 1 spray into both nostrils daily as needed for allergies or rhinitis.    [provider]  polyethylene glycol-electrolytes  (NULYTELY) 420 g solution As directed 10/10/21   Eloise Harman, DO    Allergies as of 11/25/2021   (No Known Allergies)    Family History  Problem Relation Age of Onset   Hypertension Mother    Stroke Mother    Kidney failure Mother    Hypertension Father    Diabetes Father    Hypertension Sister    Diabetes Sister    Hypertension Sister    Diabetes Sister     Social History   Socioeconomic History   Marital status: Widowed    Spouse name: Not on file   Number of children: Not on file   Years of education: Not on file   Highest education level: Not on file  Occupational History   Not on file  Tobacco Use   Smoking status: Never   Smokeless tobacco: Never  Vaping Use   Vaping Use: Never used  Substance and Sexual Activity   Alcohol use: Yes    Alcohol/week: 0.0 standard drinks of alcohol    Comment: Occasionally   Drug use: No   Sexual activity: Not Currently  Other Topics Concern   Not on file  Social History Narrative   Not on file   Social Determinants of Health   Financial Resource Strain: Not on file  Food Insecurity: Not on file  Transportation Needs: Not on file  Physical Activity: Not on file  Stress: Not on file  Social Connections: Not on file  Intimate Partner Violence: Not  on file    Review of Systems: See HPI, otherwise negative ROS  Physical Exam: Vital signs in last 24 hours: Temp:  [99.1 F (37.3 C)] 99.1 F (37.3 C) (09/15 0758) Pulse Rate:  [77] 77 (09/15 0758) Resp:  [20] 20 (09/15 0758) BP: (161)/(89) 161/89 (09/15 0758) SpO2:  [100 %] 100 % (09/15 0758) Weight:  [94.8 kg] 94.8 kg (09/15 0758)   General:   Alert,  Well-developed, well-nourished, pleasant and cooperative in NAD Head:  Normocephalic and atraumatic. Eyes:  Sclera clear, no icterus.   Conjunctiva pink. Ears:  Normal auditory acuity. Nose:  No deformity, discharge,  or lesions. Mouth:  No deformity or lesions, dentition normal. Neck:  Supple; no masses or  thyromegaly. Lungs:  Clear throughout to auscultation.   No wheezes, crackles, or rhonchi. No acute distress. Heart:  Regular rate and rhythm; no murmurs, clicks, rubs,  or gallops. Abdomen:  Soft, nontender and nondistended. No masses, hepatosplenomegaly or hernias noted. Normal bowel sounds, without guarding, and without rebound.   Msk:  Symmetrical without gross deformities. Normal posture. Extremities:  Without clubbing or edema. Neurologic:  Alert and  oriented x4;  grossly normal neurologically. Skin:  Intact without significant lesions or rashes. Cervical Nodes:  No significant cervical adenopathy. Psych:  Alert and cooperative. Normal mood and affect.  Impression/Plan: Gabriela Rush is here for a colonoscopy to be performed for surveillance purposes, personal history of adenomatous colon polyps 2018.  The risks of the procedure including infection, bleed, or perforation as well as benefits, limitations, alternatives and imponderables have been reviewed with the patient. Questions have been answered. All parties agreeable.

## 2021-12-13 NOTE — Anesthesia Preprocedure Evaluation (Signed)
Anesthesia Evaluation  Patient identified by MRN, date of birth, ID band Patient awake    Reviewed: Allergy & Precautions, NPO status , Patient's Chart, lab work & pertinent test results  History of Anesthesia Complications (+) Family history of anesthesia reaction  Airway Mallampati: III  TM Distance: >3 FB Neck ROM: Full    Dental  (+) Dental Advisory Given, Teeth Intact   Pulmonary neg pulmonary ROS,    Pulmonary exam normal breath sounds clear to auscultation       Cardiovascular hypertension, Pt. on medications Normal cardiovascular exam Rhythm:Regular Rate:Normal     Neuro/Psych negative neurological ROS  negative psych ROS   GI/Hepatic negative GI ROS, Neg liver ROS,   Endo/Other  diabetes, Well Controlled, Type 2, Oral Hypoglycemic Agents  Renal/GU negative Renal ROS  negative genitourinary   Musculoskeletal negative musculoskeletal ROS (+)   Abdominal   Peds negative pediatric ROS (+)  Hematology negative hematology ROS (+)   Anesthesia Other Findings   Reproductive/Obstetrics negative OB ROS                            Anesthesia Physical Anesthesia Plan  ASA: 2  Anesthesia Plan: General   Post-op Pain Management: Minimal or no pain anticipated   Induction: Intravenous  PONV Risk Score and Plan:   Airway Management Planned: Nasal Cannula and Natural Airway  Additional Equipment:   Intra-op Plan:   Post-operative Plan:   Informed Consent: I have reviewed the patients History and Physical, chart, labs and discussed the procedure including the risks, benefits and alternatives for the proposed anesthesia with the patient or authorized representative who has indicated his/her understanding and acceptance.     Dental advisory given  Plan Discussed with: CRNA and Surgeon  Anesthesia Plan Comments:         Anesthesia Quick Evaluation

## 2021-12-13 NOTE — Discharge Instructions (Signed)
  Colonoscopy Discharge Instructions  Read the instructions outlined below and refer to this sheet in the next few weeks. These discharge instructions provide you with general information on caring for yourself after you leave the hospital. Your doctor may also give you specific instructions. While your treatment has been planned according to the most current medical practices available, unavoidable complications occasionally occur.   ACTIVITY You may resume your regular activity, but move at a slower pace for the next 24 hours.  Take frequent rest periods for the next 24 hours.  Walking will help get rid of the air and reduce the bloated feeling in your belly (abdomen).  No driving for 24 hours (because of the medicine (anesthesia) used during the test).   Do not sign any important legal documents or operate any machinery for 24 hours (because of the anesthesia used during the test).  NUTRITION Drink plenty of fluids.  You may resume your normal diet as instructed by your doctor.  Begin with a light meal and progress to your normal diet. Heavy or fried foods are harder to digest and may make you feel sick to your stomach (nauseated).  Avoid alcoholic beverages for 24 hours or as instructed.  MEDICATIONS You may resume your normal medications unless your doctor tells you otherwise.  WHAT YOU CAN EXPECT TODAY Some feelings of bloating in the abdomen.  Passage of more gas than usual.  Spotting of blood in your stool or on the toilet paper.  IF YOU HAD POLYPS REMOVED DURING THE COLONOSCOPY: No aspirin products for 7 days or as instructed.  No alcohol for 7 days or as instructed.  Eat a soft diet for the next 24 hours.  FINDING OUT THE RESULTS OF YOUR TEST Not all test results are available during your visit. If your test results are not back during the visit, make an appointment with your caregiver to find out the results. Do not assume everything is normal if you have not heard from your  caregiver or the medical facility. It is important for you to follow up on all of your test results.  SEEK IMMEDIATE MEDICAL ATTENTION IF: You have more than a spotting of blood in your stool.  Your belly is swollen (abdominal distention).  You are nauseated or vomiting.  You have a temperature over 101.  You have abdominal pain or discomfort that is severe or gets worse throughout the day.   Your colonoscopy revealed 1 polyp(s) which I removed successfully. Await pathology results, my office will contact you. I recommend repeating colonoscopy in 5 years for surveillance purposes. Otherwise follow up with GI as needed.    I hope you have a great rest of your week!  Carroll Ranney K. Selena Swaminathan, D.O. Gastroenterology and Hepatology [facility]  

## 2021-12-13 NOTE — Op Note (Signed)
[facility] Patient Name: Gabriela Rush Procedure Date: 12/13/2021 8:59 AM MRN: [id number] Date of Birth: July 03, 1965 Attending MD: Elon Alas. Abbey Chatters DO CSN: [id number] Age: 56 Admit Type: Outpatient Procedure:                Colonoscopy Indications:              Surveillance: Personal history of adenomatous                            polyps on last colonoscopy 5 years ago Providers:                Elon Alas. Abbey Chatters, DO, Janeece Riggers, RN, Kristine L.                            Risa Grill, Technician Referring MD:             Elon Alas. Abbey Chatters, DO Medicines:                See the Anesthesia note for documentation of the                            administered medications Complications:            No immediate complications. Estimated Blood Loss:     Estimated blood loss was minimal. Procedure:                Pre-Anesthesia Assessment:                           - The anesthesia plan was to use monitored                            anesthesia care (MAC).                           After obtaining informed consent, the colonoscope                            was passed under direct vision. Throughout the                            procedure, the patient's blood pressure, pulse, and                            oxygen saturations were monitored continuously. The                            [id number]-HQ190L ([id number]) scope was introduced through                            the anus and advanced to the the cecum, identified                            by appendiceal orifice and ileocecal valve. The  colonoscopy was performed without difficulty. The                            patient tolerated the procedure well. The quality                            of the bowel preparation was evaluated using the                            BBPS [facility] Bowel Preparation Scale) with scores                            of: Right Colon = 2 (minor amount of residual                             staining, small fragments of stool and/or opaque                            liquid, but mucosa seen well), Transverse Colon = 3                            (entire mucosa seen well with no residual staining,                            small fragments of stool or opaque liquid) and Left                            Colon = 3 (entire mucosa seen well with no residual                            staining, small fragments of stool or opaque                            liquid). The total BBPS score equals 8. The quality                            of the bowel preparation was good. Scope In: 9:13:07 AM Scope Out: 9:24:08 AM Scope Withdrawal Time: 0 hours 8 minutes 55 seconds  Total Procedure Duration: 0 hours 11 minutes 1 second  Findings:      The perianal and digital rectal examinations were normal.      Internal hemorrhoids were found during endoscopy.      A 4 mm polyp was found in the ascending colon. The polyp was sessile.       The polyp was removed with a cold snare. Resection and retrieval were       complete.      The exam was otherwise without abnormality. Impression:               - Internal hemorrhoids.                           - One 4 mm polyp in the ascending colon, removed  with a cold snare. Resected and retrieved.                           - The examination was otherwise normal. Moderate Sedation:      Per Anesthesia Care Recommendation:           - Patient has a contact number available for                            emergencies. The signs and symptoms of potential                            delayed complications were discussed with the                            patient. Return to normal activities tomorrow.                            Written discharge instructions were provided to the                            patient.                           - Resume previous diet.                           - Continue present medications.                            - Await pathology results.                           - Repeat colonoscopy in 5 years for surveillance.                           - Return to GI clinic PRN. Procedure Code(s):        --- Professional ---                           [phone], Colonoscopy, flexible; with removal of                            tumor(s), polyp(s), or other lesion(s) by snare                            technique Diagnosis Code(s):        --- Professional ---                           Z86.010, Personal history of colonic polyps                           K63.5, Polyp of colon                           K64.8, Other hemorrhoids CPT copyright 2019 [employer]  [employer]. All rights reserved. The codes documented in this report are preliminary and upon coder review may  be revised to meet current compliance requirements. Elon Alas. Abbey Chatters, DO [place] Abbey Chatters, DO 12/13/2021 9:26:38 AM This report has been signed electronically. Number of Addenda: 0

## 2021-12-13 NOTE — Anesthesia Postprocedure Evaluation (Signed)
Anesthesia Post Note  Patient: Gabriela Rush  Procedure(s) Performed: COLONOSCOPY WITH PROPOFOL POLYPECTOMY  Patient location during evaluation: Phase II Anesthesia Type: General Level of consciousness: awake and alert and oriented Pain management: pain level controlled Vital Signs Assessment: post-procedure vital signs reviewed and stable Respiratory status: spontaneous breathing, nonlabored ventilation and respiratory function stable Cardiovascular status: blood pressure returned to baseline and stable Postop Assessment: no apparent nausea or vomiting Anesthetic complications: no   No notable events documented.   Last Vitals:  Vitals:   12/13/21 0758 12/13/21 0929  BP: (!) 161/89 99/64  Pulse: 77 83  Resp: 20 20  Temp: 37.3 C 36.8 C  SpO2: 100% 95%    Last Pain:  Vitals:   12/13/21 0929  TempSrc: Oral  PainSc: 0-No pain                 Sherryann Frese C Jolynn Bajorek

## 2021-12-16 LAB — SURGICAL PATHOLOGY

## 2021-12-20 ENCOUNTER — Encounter (HOSPITAL_COMMUNITY): Payer: Self-pay | Admitting: Internal Medicine
